# Patient Record
Sex: Female | Born: 1945 | Race: White | Hispanic: No | Marital: Single | Smoking: Former smoker
Health system: Southern US, Community
[De-identification: ages and names within clinical notes are randomized; demographics above are authoritative.]

## PROBLEM LIST (undated history)

## (undated) DIAGNOSIS — B182 Chronic viral hepatitis C: Secondary | ICD-10-CM

## (undated) DIAGNOSIS — K7460 Unspecified cirrhosis of liver: Secondary | ICD-10-CM

## (undated) DIAGNOSIS — Z9289 Personal history of other medical treatment: Secondary | ICD-10-CM

## (undated) DIAGNOSIS — I10 Essential (primary) hypertension: Secondary | ICD-10-CM

## (undated) HISTORY — PX: CHOLECYSTECTOMY: SHX55

## (undated) HISTORY — PX: TUBAL LIGATION: SHX77

## (undated) HISTORY — DX: Personal history of other medical treatment: Z92.89

## (undated) HISTORY — PX: TOTAL HIP ARTHROPLASTY: SHX124

## (undated) HISTORY — PX: NECK SURGERY: SHX720

---

## 2007-11-23 ENCOUNTER — Other Ambulatory Visit: Payer: Self-pay

## 2007-11-23 ENCOUNTER — Inpatient Hospital Stay: Payer: Self-pay | Admitting: Internal Medicine

## 2008-07-03 ENCOUNTER — Ambulatory Visit: Payer: Self-pay

## 2008-11-05 ENCOUNTER — Ambulatory Visit: Payer: Self-pay | Admitting: Family Medicine

## 2010-10-30 ENCOUNTER — Emergency Department: Payer: Self-pay | Admitting: Emergency Medicine

## 2011-01-13 ENCOUNTER — Emergency Department: Payer: Self-pay | Admitting: Emergency Medicine

## 2011-02-23 ENCOUNTER — Ambulatory Visit: Payer: Self-pay | Admitting: Sports Medicine

## 2012-06-11 ENCOUNTER — Emergency Department: Payer: Self-pay | Admitting: Emergency Medicine

## 2012-06-12 LAB — DRUG SCREEN, URINE
Amphetamines, Ur Screen: NEGATIVE (ref ?–1000)
Barbiturates, Ur Screen: NEGATIVE (ref ?–200)
Cocaine Metabolite,Ur ~~LOC~~: NEGATIVE (ref ?–300)
MDMA (Ecstasy)Ur Screen: NEGATIVE (ref ?–500)
Methadone, Ur Screen: NEGATIVE (ref ?–300)
Opiate, Ur Screen: POSITIVE (ref ?–300)
Phencyclidine (PCP) Ur S: NEGATIVE (ref ?–25)
Tricyclic, Ur Screen: NEGATIVE (ref ?–1000)

## 2012-06-12 LAB — LIPASE, BLOOD: Lipase: 188 U/L (ref 73–393)

## 2012-06-12 LAB — CBC
MCH: 34 pg (ref 26.0–34.0)
Platelet: 110 10*3/uL — ABNORMAL LOW (ref 150–440)
RDW: 15.7 % — ABNORMAL HIGH (ref 11.5–14.5)

## 2012-06-12 LAB — COMPREHENSIVE METABOLIC PANEL
Alkaline Phosphatase: 187 U/L — ABNORMAL HIGH (ref 50–136)
Anion Gap: 9 (ref 7–16)
BUN: 13 mg/dL (ref 7–18)
Bilirubin,Total: 0.8 mg/dL (ref 0.2–1.0)
Calcium, Total: 8.3 mg/dL — ABNORMAL LOW (ref 8.5–10.1)
Chloride: 106 mmol/L (ref 98–107)
Co2: 24 mmol/L (ref 21–32)
Creatinine: 0.94 mg/dL (ref 0.60–1.30)
EGFR (African American): >60
EGFR (Non-African Amer.): >60
Osmolality: 277 (ref 275–301)
Sodium: 139 mmol/L (ref 136–145)
Total Protein: 7.8 g/dL (ref 6.4–8.2)

## 2012-06-12 LAB — URINALYSIS, COMPLETE
Bilirubin,UR: NEGATIVE
Blood: NEGATIVE
Ketone: NEGATIVE
Leukocyte Esterase: NEGATIVE
Ph: 5 (ref 4.5–8.0)
Protein: NEGATIVE
RBC,UR: 4 /HPF (ref 0–5)
Specific Gravity: 1.019 (ref 1.003–1.030)
Squamous Epithelial: 2

## 2012-06-12 LAB — ETHANOL: Ethanol %: 0.119 % — ABNORMAL HIGH (ref 0.000–0.080)

## 2012-07-07 ENCOUNTER — Ambulatory Visit: Payer: Self-pay | Admitting: Family Medicine

## 2012-08-08 ENCOUNTER — Ambulatory Visit: Payer: Self-pay | Admitting: Internal Medicine

## 2012-08-21 ENCOUNTER — Ambulatory Visit: Payer: Self-pay | Admitting: Specialist

## 2012-08-21 LAB — URINALYSIS, COMPLETE
Bacteria: NONE SEEN
Glucose,UR: NEGATIVE mg/dL (ref 0–75)
Nitrite: NEGATIVE
Ph: 5 (ref 4.5–8.0)
Protein: NEGATIVE
Specific Gravity: 1.02 (ref 1.003–1.030)
WBC UR: 7 /HPF (ref 0–5)

## 2012-08-21 LAB — PROTIME-INR
INR: 1.1
Prothrombin Time: 14.7 secs (ref 11.5–14.7)

## 2012-08-21 LAB — BASIC METABOLIC PANEL
Anion Gap: 8 (ref 7–16)
BUN: 13 mg/dL (ref 7–18)
Chloride: 107 mmol/L (ref 98–107)
Co2: 27 mmol/L (ref 21–32)
Creatinine: 0.63 mg/dL (ref 0.60–1.30)
EGFR (African American): >60
EGFR (Non-African Amer.): >60
Potassium: 3.7 mmol/L (ref 3.5–5.1)
Sodium: 142 mmol/L (ref 136–145)

## 2012-08-21 LAB — HEMOGLOBIN: HGB: 12.6 g/dL (ref 12.0–16.0)

## 2012-08-31 ENCOUNTER — Inpatient Hospital Stay: Payer: Self-pay | Admitting: Specialist

## 2012-09-01 LAB — BASIC METABOLIC PANEL
BUN: 13 mg/dL (ref 7–18)
Calcium, Total: 7.8 mg/dL — ABNORMAL LOW (ref 8.5–10.1)
Chloride: 104 mmol/L (ref 98–107)
Co2: 24 mmol/L (ref 21–32)
Creatinine: 1.13 mg/dL (ref 0.60–1.30)
EGFR (African American): 59 — ABNORMAL LOW
EGFR (Non-African Amer.): 51 — ABNORMAL LOW
Glucose: 113 mg/dL — ABNORMAL HIGH (ref 65–99)
Osmolality: 271 (ref 275–301)
Sodium: 135 mmol/L — ABNORMAL LOW (ref 136–145)

## 2012-09-02 LAB — BASIC METABOLIC PANEL
Anion Gap: 8 (ref 7–16)
BUN: 21 mg/dL — ABNORMAL HIGH (ref 7–18)
Co2: 22 mmol/L (ref 21–32)
EGFR (Non-African Amer.): 33 — ABNORMAL LOW
Glucose: 140 mg/dL — ABNORMAL HIGH (ref 65–99)
Osmolality: 272 (ref 275–301)

## 2012-09-02 LAB — CBC WITH DIFFERENTIAL/PLATELET
Basophil #: 0 10*3/uL (ref 0.0–0.1)
Basophil %: 0.2 %
Eosinophil #: 0.4 10*3/uL (ref 0.0–0.7)
Eosinophil %: 3.9 %
HCT: 25.9 % — ABNORMAL LOW (ref 35.0–47.0)
HGB: 9.2 g/dL — ABNORMAL LOW (ref 12.0–16.0)
Lymphocyte %: 14.3 %
Monocyte #: 0.9 x10 3/mm (ref 0.2–0.9)
Monocyte %: 10.4 %
Neutrophil %: 71.2 %
Platelet: 92 10*3/uL — ABNORMAL LOW (ref 150–440)
RBC: 2.7 10*6/uL — ABNORMAL LOW (ref 3.80–5.20)
RDW: 15.4 % — ABNORMAL HIGH (ref 11.5–14.5)
WBC: 9.2 10*3/uL (ref 3.6–11.0)

## 2012-09-03 LAB — CBC WITH DIFFERENTIAL/PLATELET
Basophil %: 0.4 %
Eosinophil #: 0.3 10*3/uL (ref 0.0–0.7)
Eosinophil %: 2.5 %
HCT: 26.5 % — ABNORMAL LOW (ref 35.0–47.0)
HGB: 9.2 g/dL — ABNORMAL LOW (ref 12.0–16.0)
Lymphocyte #: 1.2 10*3/uL (ref 1.0–3.6)
MCH: 33.3 pg (ref 26.0–34.0)
MCHC: 34.7 g/dL (ref 32.0–36.0)
MCV: 96 fL (ref 80–100)
Monocyte #: 1.1 x10 3/mm — ABNORMAL HIGH (ref 0.2–0.9)
Monocyte %: 10.9 %
Neutrophil #: 7.6 10*3/uL — ABNORMAL HIGH (ref 1.4–6.5)
Neutrophil %: 74.5 %
Platelet: 104 10*3/uL — ABNORMAL LOW (ref 150–440)
RBC: 2.76 10*6/uL — ABNORMAL LOW (ref 3.80–5.20)

## 2012-09-03 LAB — URINALYSIS, COMPLETE
Bilirubin,UR: NEGATIVE
Glucose,UR: NEGATIVE mg/dL (ref 0–75)
Ketone: NEGATIVE
Nitrite: NEGATIVE
Ph: 6 (ref 4.5–8.0)
Protein: NEGATIVE
Specific Gravity: 1.004 (ref 1.003–1.030)
Squamous Epithelial: 4
WBC UR: 2 /HPF (ref 0–5)

## 2012-09-03 LAB — SODIUM, URINE, RANDOM: Sodium, Urine Random: 10 mmol/L (ref 20–110)

## 2012-09-04 LAB — CBC WITH DIFFERENTIAL/PLATELET
Basophil #: 0.1 10*3/uL (ref 0.0–0.1)
Eosinophil %: 5.6 %
HCT: 25.8 % — ABNORMAL LOW (ref 35.0–47.0)
Lymphocyte %: 24.8 %
MCH: 33.4 pg (ref 26.0–34.0)
MCHC: 35 g/dL (ref 32.0–36.0)
MCV: 96 fL (ref 80–100)
Monocyte %: 10.6 %
Neutrophil %: 58.1 %
Platelet: 95 10*3/uL — ABNORMAL LOW (ref 150–440)
RBC: 2.7 10*6/uL — ABNORMAL LOW (ref 3.80–5.20)
RDW: 15.7 % — ABNORMAL HIGH (ref 11.5–14.5)

## 2012-09-04 LAB — BASIC METABOLIC PANEL
Anion Gap: 7 (ref 7–16)
Calcium, Total: 7.8 mg/dL — ABNORMAL LOW (ref 8.5–10.1)
Co2: 23 mmol/L (ref 21–32)
Creatinine: 0.74 mg/dL (ref 0.60–1.30)
EGFR (African American): >60
EGFR (Non-African Amer.): >60
Glucose: 89 mg/dL (ref 65–99)
Osmolality: 287 (ref 275–301)
Sodium: 144 mmol/L (ref 136–145)

## 2012-09-05 LAB — PATHOLOGY REPORT

## 2013-08-28 ENCOUNTER — Ambulatory Visit: Payer: Self-pay | Admitting: Physical Medicine and Rehabilitation

## 2013-09-12 ENCOUNTER — Ambulatory Visit: Payer: Self-pay | Admitting: Physical Medicine and Rehabilitation

## 2013-09-24 ENCOUNTER — Ambulatory Visit: Payer: Self-pay | Admitting: Internal Medicine

## 2014-01-29 ENCOUNTER — Ambulatory Visit: Payer: Self-pay | Admitting: Otolaryngology

## 2014-01-29 LAB — CREATININE, SERUM
Creatinine: 1.35 mg/dL — ABNORMAL HIGH (ref 0.60–1.30)
EGFR (African American): 47 — ABNORMAL LOW
EGFR (Non-African Amer.): 41 — ABNORMAL LOW

## 2014-06-19 DIAGNOSIS — M5116 Intervertebral disc disorders with radiculopathy, lumbar region: Secondary | ICD-10-CM | POA: Insufficient documentation

## 2014-06-19 DIAGNOSIS — M5136 Other intervertebral disc degeneration, lumbar region: Secondary | ICD-10-CM | POA: Insufficient documentation

## 2014-08-27 ENCOUNTER — Ambulatory Visit: Payer: Self-pay | Admitting: Obstetrics and Gynecology

## 2014-08-27 LAB — HEMOGLOBIN: HGB: 13.2 g/dL (ref 12.0–16.0)

## 2014-08-27 LAB — POTASSIUM: POTASSIUM: 3.8 mmol/L (ref 3.5–5.1)

## 2014-09-02 ENCOUNTER — Ambulatory Visit: Payer: Self-pay | Admitting: Obstetrics and Gynecology

## 2015-02-18 NOTE — Consult Note (Signed)
PATIENT NAME:  Yvonne Mcdaniel, Yvonne Mcdaniel MR#:  [id number] DATE OF BIRTH:  [birth year]  DATE OF CONSULTATION:  09/03/2012  REFERRING PHYSICIAN:  Thornton Park, MD CONSULTING PHYSICIAN:  Samson Frederic, DO  PRIMARY CARE PHYSICIAN: Valentino Saxon, MD  REASON FOR CONSULTATION: Wheezing and altered mental status.   HISTORY OF PRESENT ILLNESS: This is a 69 year old female with history of severe osteoarthritis status post left hip replacement three days ago who has now developed progressive lethargy and altered mental status and apparently wheezing this evening. Today is  postoperative day three for this patient. She had left total hip arthroplasty on 08/31/2012 after which she was noted to have decreased urine output and this has persisted up until my evaluation. She has received a dose of lasix a well as cefazolin and toradol.  Per nursing, over the preceding 12 hours or so, the patient has been lethargic. However, she has become more disoriented now and had minimal urine output so Foley catheter was placed and 1000 mL of urine was collected. Upon my evaluation the patient is confused, unable to answer questions appropriately. Review of records notes that the patient's creatinine has increased from 0.63 on 08/21/2012 to 1.63 during this admission, concerning for acute renal failure.   On Review of records, the patient has received 40 mg of oxycodone over the last 12 hours as well.   PAST MEDICAL HISTORY:  1. Chronic pain.  2. Hepatitis C.  3. Hypertension.  4. Vertigo.  5. Anxiety.  6. Obesity.  7. Degenerative joint disease.   PAST SURGICAL HISTORY:  1. Cesarean section.  2. Cervical fusion.  3. Cholecystectomy.  4. Prior hospitalizations for pneumonia.   MEDICATIONS:  1. Quinapril 20 mg daily.  2. Metaxalone 800 mg 1 tablet twice a day as needed for pain.  3. Meloxicam 15 mg daily; this was stopped 08/20/2012.  4. Lorazepam 0.5 mg daily as needed for anxiety.  5. Hydrochlorothiazide 25 mg  daily.  6. Gabapentin 300 mg three times daily. 7. Amlodipine 5 mg daily  8. Acetaminophen/oxycodone 325/5 mg one tablet every eight hours as needed for pain.  ALLERGIES: No known drug allergies, per records.   SOCIAL HISTORY: The patient was a two pack per day smoker for at least eight years, quit 10 years ago. Denies alcohol or illicit drug use.   FAMILY HISTORY: Per records, history of congestive heart failure and hypertension.   REVIEW OF SYSTEMS: Unable to obtain due to the patient's mental status at this time. However, per nursing, the patient has been progressively confused, has had decreased urine output, has been afebrile, and has not been coughing but had intermittent wheeze.   PHYSICAL EXAMINATION:   VITAL SIGNS: Temperature 98.2, heart rate 100, respirations 22, blood pressure 153/70, and saturating 97% on room air   GENERAL: A well-appearing, elderly female in no acute distress, agitated.   PSYCH: The patient is disoriented. She does not respond appropriately to questions, judgment as a result is poor.   EYES: Pupils are equally round and reactive to light and accommodation. Anicteric sclerae. Normal lids.   ENT: Normal external ears and nares. Posterior oropharynx is clear.   SKIN: Warm and dry. No lesions identified. There are some excoriations over bilateral thighs. Dressing on the left hip is clean, dry, and intact without any surrounding erythema.   CARDIOVASCULAR: S1 and S2 regular rate and rhythm. No murmurs appreciated. No pretibial edema.   PULMONARY: Clear to auscultation bilaterally. No wheezes, rales, or rhonchi. Normal effort.  ABDOMEN: Soft, nontender, and nondistended with normal bowel sounds. No hepatomegaly appreciated.   MUSCULOSKELETAL: Full range of motion of upper extremities. Unable to fully assess lower extremities due to the patient's mental status. No clubbing and no cyanosis noted.   LABORATORY DATA: BMP from 09/02/2012 shows glucose 140, BUN  21, and creatinine 1.63 (this is increased from baseline creatinine of 0.63, as of 08/21/2012), serum sodium 133, potassium 4.9, chloride 103, bicarbonate 22, calcium 7.5, anion gap 8, and osmolality 272 with a GFR of 33.   CBC as 09/02/2012 shows white count 9.2, hemoglobin 9.2, hematocrit 25.9, platelet count 92, and MCV 96.   ASSESSMENT AND PLAN: A 69 year old female admitted for left total hip arthroplasty now with altered mental status, acute renal failure, SIRS, and acute anemia.  1. Toxic metabolic encephalopathy: Etiology is multifactorial including acute renal failure, recent opiate use, and possible underlying infection. We will check repeat CBC, basic metabolic panel, urinalysis, urine culture and blood culture, and check an ABG to rule out hypercarbia given her recent opiate use. We will discontinue opiates. 2. Acute renal failure: Baseline creatinine was 0.63 and now is increased to 1.63. Differential diagnosis includes urinary tension from opiate versus AIN from cefazolin or Toradol. We will also rule out possibility of urinary tract infections with urinalysis and urine culture. We will check renal ultrasound, continue IV fluids, check urine eosinophils and urine electrolytes.  We will discontinue her home diuretics and avoid all nehrotoxic agents including NSAIDS. If infection is identified, we will initiate antibiotics at that time.  3. SIRS with tachycardia and tachypnea: We will check blood and urine cultures, repeat CBC and initiate antibiotics if infectious etiology is identified. 4. Wheezing: Wheezing was not present on my exam. We will check an x-ray and avoid diuretics given acute renal failure. We will have p.r.n. nebulizers ordered and available.  5. Acute blood loss anemia: Hemoglobin on admission was 12.6 and has decreased to 9.2. We will monitor CBCs for now.  6. Chronic back pain.  7. Obesity.  8. Hepatitis C.  9. Vertigo.  10. Anxiety.   CODE STATUS: PRESUMED FULL CODE.    TIME SPENT: 45 minutes. ____________________________ Samson Frederic, DO aeo:slb D: 09/03/2012 04:59:28 ET T: 09/03/2012 15:22:22 ET JOB#: [id number]  cc: Samson Frederic, DO, <Dictator> Timoteo Gaul, MD Rachelle Hora. Heber [place], MD [place] SIGNED 09/04/2012 2:31

## 2015-02-18 NOTE — Discharge Summary (Signed)
PATIENT NAME:  Yvonne Mcdaniel, Yvonne Mcdaniel MR#:  [id number] DATE OF BIRTH:  [birth year]  DATE OF ADMISSION:  08/31/2012 DATE OF DISCHARGE:  09/05/2012  DISCHARGE DIAGNOSES:  1. Severe degenerative arthritis, left hip.  2. Postoperative anemia.  3. Postoperative transient acute renal failure.  4. Methicillin-resistant Staphylococcus aureus positive nasal swab.  5. History of hepatitis C.  6. Gout.  7. Hypertension.  8. History of pneumonia.   OPERATIONS/PROCEDURES PERFORMED: Left hip bipolar hip hemiarthroplasty.   COURSE IN HOSPITAL: Admission history and physical examination is as written on the admission. The patient was seen preoperatively by both Dr. Heber Trosky and also cardiology for medical clearance. The procedure was fully explained to the patient including the expected postoperative rehab. I explained to the patient preoperatively that if her acetabulum appeared reasonable that I would perform a bipolar left hip arthroplasty instead of a total hip arthroplasty because of her size and her limited mobility. I felt that this would be more stable with respect to dislocation. The patient underwent the procedure on 08/31/2012 and tolerated this quite well. Postoperatively she did well on postoperative day #1 but did develop a transient acute renal failure with mental status changes which quickly reversed with discontinuing her opiates and her Toradol. She was treated with hydration and her mental status and laboratory data returned to within normal limits. By 09/05/2012 she had a normal mental status and was tolerating physical therapy reasonably well. She is discharged today to the skilled nursing facility with orders as per the written order sheet. In physical therapy I only want her to be assisted up into the chair full weight-bearing and to work on transfers using the walker and ambulation using the walker full weight-bearing. Please do not attempt to exercise her leg or manipulate her leg in any way and  total hip precautions are to be maintained at all times. Please discontinue her Lovenox in 10 days and start enteric-coated aspirin 81 mg per day. Also if her wound is seen to be healing in well in 10 days without drainage or evidence of infection please remove her staples and apply Steri-Strips.   She is to return to the office to see Dr. Tamala Julian in three weeks for repeat exam and x-ray.    ____________________________ Lucas Mallow, MD ces:cms D: 09/05/2012 08:38:10 ET T: 09/05/2012 08:55:10 ET JOB#: [id number]  cc: Lucas Mallow, MD, <Dictator> Lucas Mallow MD ELECTRONICALLY SIGNED 09/06/2012 17:56

## 2015-02-18 NOTE — Consult Note (Signed)
PATIENT NAME:  Yvonne Mcdaniel, Yvonne Mcdaniel MR#:  [id number] DATE OF BIRTH:  Sep 03, 1946  DATE OF CONSULTATION:  09/03/2012  REFERRING PHYSICIAN:  Cindie Laroche, DO CONSULTING PHYSICIAN:  Mckade Gurka Lilian Kapur, MD  REASON FOR CONSULTATION: Acute renal failure.   HISTORY OF PRESENT ILLNESS: The patient is a 69 year old Caucasian female with past medical history of chronic hepatitis C secondary to blood transfusion in 1970, hypertension, glucose intolerance, gouty arthritis, chronic back pain, vertigo, and anxiety who presented to [facility] on 08/31/2012 for bipolar left hip hemiarthroplasty. We are consulted now for the evaluation and management of acute renal failure. The patient currently is unable to offer history given altered mental status. History is obtained through chart review. When she presented, the patient's creatinine was found to be 0.63. Creatinine has now risen to 1.63. There have been some isolated periods of hypotension. In addition, when the hospitalist, Dr. Cindie Laroche, saw the patient, she was found to have significant urinary retention as after Foley catheter was placed 1000 mL of urine was drained. Over the past 24 hours, the patient has had 1.3 liters of urine. She is currently receiving normal saline. Renal ultrasound is currently pending. She also appears to have acute blood loss anemia with a hemoglobin of 9.2. Urine sodium is quite low at 10. The patient has also received vancomycin. The patient was on quinapril which has been held. She was also taking hydrochlorothiazide which has now been discontinued. The patient has also received Toradol during this admission. These are all likely explanations for underlying acute renal failure.   PAST MEDICAL HISTORY:  1. Chronic hepatitis C.  2. Hypertension.  3. History of gouty arthritis.  4. Chronic back pain.  5. Vertigo.  6. Anxiety.   PAST SURGICAL HISTORY:  1. Cholecystectomy.  2. Cesarean section. 3. Cervical disk  neck surgery. 4. Left hip replacement on 08/31/2012.   ALLERGIES: No known drug allergies.   CURRENT INPATIENT MEDICATIONS:  1. Amlodipine 5 mg daily. 2. Gabapentin 300 mg three times daily. 3. Ativan 0.5 mg daily p.r.n.  4. Omeprazole 20 mg p.o. every 6:00 a.m.  5. Lovenox 30 mg subcutaneous every 12 hours.  6. Colace 100 mg twice a day. 7. Milk of Magnesia 30 mL p.o. every six hours p.r.n.   SOCIAL HISTORY: Unable to obtain directly from the patient; however, the patient previously lived in [place] but has been in [place] since 2009. No reported tobacco, alcohol, or illicit drug use.   FAMILY HISTORY: Unable to obtain directly from the patient at this time, however, from prior dictated history and physical it appears that there is a family history of congestive heart failure and hypertension.  REVIEW OF SYSTEMS: Currently unobtainable from the patient as she has altered mental status.   PHYSICAL EXAMINATION:   VITAL SIGNS: Temperature 98.3, pulse 101, respirations 20, blood pressure 152/66, and pulse oximetry 99% on room air.   GENERAL: Well-developed, well-nourished Caucasian female who is lethargic but arousable.   HEENT: Normocephalic, atraumatic. Extraocular movements were present, one eye open, of the patient's eyes. Pupils were 5 mm bilaterally and reactive to light. No scleral icterus. Conjunctivae are pale. No epistaxis noted. Unable to assess hearing at this time. Oral mucosa are dry.   NECK: Supple and without obvious jugular venous distention or lymphadenopathy.   PULMONARY: Lungs are clear to auscultation bilaterally with normal respiratory effort.   CARDIOVASCULAR: S1 and S2 with slight tachycardia noted. No murmurs or rubs.   ABDOMEN: Soft, nontender,  and nondistended. Bowel sounds positive. No rebound or guarding. No gross organomegaly appreciated.   EXTREMITIES: No clubbing, cyanosis, or edema at this time.   NEUROLOGIC: The patient is lethargic but is  arousable. Difficult to comprehend her speech at this time.   SKIN: Warm and dry. No rashes noted.   MUSCULOSKELETAL: No joint redness, swelling, or tenderness appreciated.   GU: Foley catheter noted to be in place. No suprapubic tenderness is noted at this time.   PSYCHIATRIC: Unable to assess at this time.   LABORATORY/DIAGNOSTIC DATA: Sodium 133, potassium 4.9, chloride 103, CO2 22, BUN 21, creatinine 1.63, glucose 140, and calcium 7.5. CBC shows WBC 7.2, hemoglobin 9.2, hematocrit 26, and platelets 104. Urine sodium is quite low at 10. Urine creatinine is 45.   Urinalysis is negative for protein, 13 RBCs per high-power field, 2 WBCs per high-power field.   ABG shows pH of 7.46, pCO2 29, pO2 85, and FiO2 21%.   IMPRESSION: This is a 69 year old Caucasian female with past medical history of chronic hepatitis C secondary to blood transfusion in 1970, hypertension, gouty arthritis, osteoarthritis, chronic back pain, and anxiety who underwent left hip replacement on 08/31/2012. Her hospital course is complicated by acute renal failure and altered mental status.   PROBLEM LIST:  1. Acute renal failure.  2. Hyponatremia.  3. Altered mental status.  4. Status post left hip hemiarthroplasty on 08/31/2012.  PLAN: The patient has acute renal failure at this point in time. Her baseline creatinine is 0.63. It has now risen to 1.63. Her acute renal failure is likely multifactorial with contributions from quinapril, Toradol, hydrochlorothiazide, periodic hypotension, and urinary retention. I agree with the current plan of IV fluid hydration. Foley catheter has been placed to treat any underlying urinary retention. I agree with plans for renal ultrasound. I also agree with holding hydrochlorothiazide, quinapril, and any further NSAID use. There is no acute indication for dialysis at this time as electrolytes currently are acceptable. However, the patient is at risk for further deterioration of her renal  function. Serum sodium should be corrected with the use of normal saline. In regards to her mental status, certainly acute renal failure and use of opiates could cause the current situation. However, we would recommend considering CT scan of the head as well if her alteration of mental status persists. However, I defer this further to the hospitalist.   I would like to thank Dr. Cindie Laroche for this kind referral. Further plan as the patient progresses.  ____________________________ Tama High, MD mnl:slb D: 09/03/2012 09:57:21 ET T: 09/03/2012 10:17:00 ET JOB#: [id number]  cc: Tama High, MD, <Dictator>  Tama High MD ELECTRONICALLY SIGNED 09/12/2012 10:47

## 2015-02-18 NOTE — Op Note (Signed)
PATIENT NAME:  Yvonne Mcdaniel, Yvonne Mcdaniel MR#:  [id number] DATE OF BIRTH:  [birth year]  DATE OF PROCEDURE:  08/31/2012  PREOPERATIVE DIAGNOSIS: Severe degenerative arthritis, left hip.   POSTOPERATIVE DIAGNOSIS: Severe degenerative arthritis, left hip.  PROCEDURE: Left hip bipolar arthroplasty.   SURGEON: Christophe Louis, MD   ASSISTANT: Earnestine Leys, MD   ANESTHESIA: Spinal.   COMPLICATIONS: None.   ESTIMATED BLOOD LOSS: 500 mL.   PROCEDURE: One gram of vancomycin was given intravenously prior to the procedure. Spinal anesthesia is induced. The patient is turned into the right lateral decubitus position and prepared and secured for left hip surgery in the usual manner with the hip grips. The left hip is thoroughly prepped with alcohol and ChloraPrep and draped in standard sterile fashion. Standard posterolateral incision is made and the dissection carried down through the fascia lata in the line of the skin incision. Trochanteric bursa is excised. The sciatic nerve is palpated deep within the wound and is protected throughout the case. Trochanteric bursa is excised. The piriformis tendon is dissected out, cut and tagged for later reattachment. The remainder of the external rotators are cut with the coagulation Bovie, and the capsule is revealed using the periosteal elevator. A T-shaped incision is made in the capsule and the ends are tagged for later use. The hip is then able to be dislocated and retractors are placed. The femoral neck is cut off at the appropriate angle and length. The proximal femur is then reamed in the standard fashion and broached in standard fashion up to a 12 mm in diameter short stature AML prosthesis. The acetabulum is inspected and is seen to be round, and because of the patient's size and for the purposes of attempting to prevent dislocation, a bipolar approach was chosen. The acetabulum is sized as a 44 mm, and the acetabular sizers are used to confirm this. The acetabulum is  cleared of all debris. The trial reduction using the bipolar components and a 1.5 femoral neck length is used and is seen to be satisfactory with good neck length and good hip stability. There was some tendency for posterior dislocation, and therefore it was felt that the Prodigy anterior anteversion prosthesis should be used. All trial components are removed and the joint and bone are thoroughly irrigated multiple times with the pulsatile lavage. The 12 mm short stature Prodigy prosthesis is impacted into place and seen to be a secure fit. The 1.5 mm ball with the associated 44 mm bipolar components are placed over the ball. The hip is reduced and is seen to be stable with good leg lengths. The posterior capsule is securely repaired with #2 Ethibond. The capsule and the piriformis tendon are securely repaired back to the abductor mechanism. The fascia lata is closed with #2 Ethibond. Subcutaneous tissue is closed with 2-0 Vicryl, and the skin is closed with the skin stapler. A soft bulky dressing is applied. The patient is returned to the hospital bed and placed in an abduction pillow and returned to the recovery room in satisfactory condition having tolerated the procedure quite well.   ____________________________ Lucas Mallow, MD ces:cbb D: 08/31/2012 15:08:00 ET T: 08/31/2012 15:39:36 ET JOB#: [id number]  cc: Lucas Mallow, MD, <Dictator> Lucas Mallow MD ELECTRONICALLY SIGNED 09/01/2012 9:44

## 2015-02-22 NOTE — Op Note (Signed)
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  OPERATION: LEEP cone.  Biopsy of the cervix.   ANESTHESIA: General.   SURGEON: Rubie Maid, M.D.   ESTIMATED BLOOD LOSS:    25 mL   OPERATIVE FLUIDS: 700 mL.   URINE OUTPUT:  200 mL.   COMPLICATIONS: None.   FINDINGS:  A stenotic cervical os. Lugol staining with decreased uptake at approximately 12 o'clock.   SPECIMEN:  Ectocervix and endocervix.   CONDITION: Stable.   PROCEDURE: The patient was taken to the operating room where she was placed under general anesthesia without difficulty. She was then prepped and draped in a normal sterile fashion and placed in the dorsal lithotomy position using candy cane stirrups. Straight catheterization was performed with return of 200 mL of clear urine. Next, a Gray speculum was placed in the vagina. Lugol solution was then painted along the entire cervix and vaginal wall. Areas of non-uptake were noted to be around the entire squamocolumnar junction and at the 12:00 region of the cervix. Approximately 10 mL of 1% Xylocaine with epinephrine 1:100,000 was injected circumferentially along the cervix. The large loop electrode was then used to remove the  ectocervix. The specimen was excised and sent to the pathologist. Next, a smaller loop electrode was used to obtain a top hat for excision of the endocervix.  An endocervical curettage was attempted; however, could not be performed as the endocervix was still noted to be stenotic and could not find entrance into the cervical canal to be able to collect the endocervical specimens. The bed of the excised cervical tissue along the cervix was then cauterized using the rollerball. Hemostasis was noted. All instruments were removed from the vagina. At this point, the patient tolerated the procedure well without complication and was taken to the recovery  room in stable condition.   ____________________________ Chesley Noon Marcelline Mates, MD asc:jw D: 09/02/2014 08:45:40 ET T: 09/02/2014 15:18:58 ET JOB#: [id number]  cc: Chesley Noon. Marcelline Mates, MD, <Dictator> Augusto Gamble MD ELECTRONICALLY SIGNED 09/20/2014 0:29

## 2015-02-24 LAB — SURGICAL PATHOLOGY

## 2015-08-10 ENCOUNTER — Emergency Department: Payer: Medicare Other

## 2015-08-10 ENCOUNTER — Inpatient Hospital Stay
Admission: EM | Admit: 2015-08-10 | Discharge: 2015-08-16 | DRG: 871 | Disposition: A | Discharge status: Home / Self Care | Payer: Medicare Other | Attending: Internal Medicine | Admitting: Internal Medicine

## 2015-08-10 ENCOUNTER — Encounter: Payer: Self-pay | Admitting: Emergency Medicine

## 2015-08-10 DIAGNOSIS — E119 Type 2 diabetes mellitus without complications: Secondary | ICD-10-CM

## 2015-08-10 DIAGNOSIS — I248 Other forms of acute ischemic heart disease: Secondary | ICD-10-CM | POA: Diagnosis present

## 2015-08-10 DIAGNOSIS — R112 Nausea with vomiting, unspecified: Secondary | ICD-10-CM

## 2015-08-10 DIAGNOSIS — D696 Thrombocytopenia, unspecified: Secondary | ICD-10-CM | POA: Diagnosis present

## 2015-08-10 DIAGNOSIS — R778 Other specified abnormalities of plasma proteins: Secondary | ICD-10-CM

## 2015-08-10 DIAGNOSIS — E872 Acidosis, unspecified: Secondary | ICD-10-CM | POA: Diagnosis present

## 2015-08-10 DIAGNOSIS — K7290 Hepatic failure, unspecified without coma: Secondary | ICD-10-CM

## 2015-08-10 DIAGNOSIS — Z8249 Family history of ischemic heart disease and other diseases of the circulatory system: Secondary | ICD-10-CM

## 2015-08-10 DIAGNOSIS — K7682 Hepatic encephalopathy: Secondary | ICD-10-CM

## 2015-08-10 DIAGNOSIS — D684 Acquired coagulation factor deficiency: Secondary | ICD-10-CM | POA: Diagnosis present

## 2015-08-10 DIAGNOSIS — E669 Obesity, unspecified: Secondary | ICD-10-CM | POA: Diagnosis present

## 2015-08-10 DIAGNOSIS — I129 Hypertensive chronic kidney disease with stage 1 through stage 4 chronic kidney disease, or unspecified chronic kidney disease: Secondary | ICD-10-CM | POA: Diagnosis present

## 2015-08-10 DIAGNOSIS — N189 Chronic kidney disease, unspecified: Secondary | ICD-10-CM | POA: Diagnosis present

## 2015-08-10 DIAGNOSIS — K7460 Unspecified cirrhosis of liver: Secondary | ICD-10-CM | POA: Diagnosis present

## 2015-08-10 DIAGNOSIS — Z87891 Personal history of nicotine dependence: Secondary | ICD-10-CM

## 2015-08-10 DIAGNOSIS — D689 Coagulation defect, unspecified: Secondary | ICD-10-CM

## 2015-08-10 DIAGNOSIS — K769 Liver disease, unspecified: Secondary | ICD-10-CM

## 2015-08-10 DIAGNOSIS — Z6832 Body mass index (BMI) 32.0-32.9, adult: Secondary | ICD-10-CM

## 2015-08-10 DIAGNOSIS — E1165 Type 2 diabetes mellitus with hyperglycemia: Secondary | ICD-10-CM | POA: Diagnosis present

## 2015-08-10 DIAGNOSIS — G9340 Encephalopathy, unspecified: Secondary | ICD-10-CM | POA: Diagnosis present

## 2015-08-10 DIAGNOSIS — I471 Supraventricular tachycardia: Secondary | ICD-10-CM | POA: Diagnosis present

## 2015-08-10 DIAGNOSIS — I4891 Unspecified atrial fibrillation: Secondary | ICD-10-CM | POA: Diagnosis present

## 2015-08-10 DIAGNOSIS — E1122 Type 2 diabetes mellitus with diabetic chronic kidney disease: Secondary | ICD-10-CM | POA: Diagnosis present

## 2015-08-10 DIAGNOSIS — A419 Sepsis, unspecified organism: Secondary | ICD-10-CM | POA: Diagnosis present

## 2015-08-10 DIAGNOSIS — R6520 Severe sepsis without septic shock: Secondary | ICD-10-CM | POA: Diagnosis present

## 2015-08-10 DIAGNOSIS — E1142 Type 2 diabetes mellitus with diabetic polyneuropathy: Secondary | ICD-10-CM

## 2015-08-10 DIAGNOSIS — N289 Disorder of kidney and ureter, unspecified: Secondary | ICD-10-CM

## 2015-08-10 DIAGNOSIS — K7200 Acute and subacute hepatic failure without coma: Secondary | ICD-10-CM | POA: Diagnosis present

## 2015-08-10 DIAGNOSIS — R7989 Other specified abnormal findings of blood chemistry: Secondary | ICD-10-CM | POA: Diagnosis present

## 2015-08-10 DIAGNOSIS — G609 Hereditary and idiopathic neuropathy, unspecified: Secondary | ICD-10-CM

## 2015-08-10 DIAGNOSIS — N179 Acute kidney failure, unspecified: Secondary | ICD-10-CM | POA: Diagnosis present

## 2015-08-10 DIAGNOSIS — R0682 Tachypnea, not elsewhere classified: Secondary | ICD-10-CM | POA: Diagnosis present

## 2015-08-10 DIAGNOSIS — I4892 Unspecified atrial flutter: Secondary | ICD-10-CM

## 2015-08-10 DIAGNOSIS — Z79899 Other long term (current) drug therapy: Secondary | ICD-10-CM

## 2015-08-10 DIAGNOSIS — B182 Chronic viral hepatitis C: Secondary | ICD-10-CM | POA: Diagnosis present

## 2015-08-10 DIAGNOSIS — E876 Hypokalemia: Secondary | ICD-10-CM | POA: Diagnosis present

## 2015-08-10 HISTORY — DX: Unspecified cirrhosis of liver: K74.60

## 2015-08-10 HISTORY — DX: Essential (primary) hypertension: I10

## 2015-08-10 HISTORY — DX: Chronic viral hepatitis C: B18.2

## 2015-08-10 LAB — CBC WITH DIFFERENTIAL/PLATELET
Basophils Absolute: 0 10*3/uL (ref 0–0.1)
Basophils Relative: 0 %
Eosinophils Absolute: 0 10*3/uL (ref 0–0.7)
Eosinophils Relative: 0 %
HEMATOCRIT: 37.9 % (ref 35.0–47.0)
HEMOGLOBIN: 12.5 g/dL (ref 12.0–16.0)
LYMPHS ABS: 0.9 10*3/uL — AB (ref 1.0–3.6)
LYMPHS PCT: 8 %
MCH: 32 pg (ref 26.0–34.0)
MCHC: 33 g/dL (ref 32.0–36.0)
MCV: 97 fL (ref 80.0–100.0)
MONO ABS: 0.6 10*3/uL (ref 0.2–0.9)
MONOS PCT: 6 %
NEUTROS ABS: 9.6 10*3/uL — AB (ref 1.4–6.5)
NEUTROS PCT: 86 %
Platelets: 130 10*3/uL — ABNORMAL LOW (ref 150–440)
RBC: 3.91 MIL/uL (ref 3.80–5.20)
RDW: 15.3 % — AB (ref 11.5–14.5)
WBC: 11.2 10*3/uL — ABNORMAL HIGH (ref 3.6–11.0)

## 2015-08-10 MED ORDER — SODIUM CHLORIDE 0.9 % IV BOLUS (SEPSIS)
1000.0000 mL | Freq: Once | INTRAVENOUS | Status: AC
  Administered 2015-08-10: 1000 mL via INTRAVENOUS

## 2015-08-10 NOTE — ED Notes (Signed)
Pt assisted on bedpan at request to urinate; perineum clensed thoroughly in prep for cc urine specimen; redness noted around rectal area; pt unable to void

## 2015-08-10 NOTE — ED Notes (Signed)
MD in to assess pt; to alert to self and place but unable to state correct date; pt admits to recent N/V/D; denies c/o at present

## 2015-08-10 NOTE — ED Notes (Signed)
EMS called by patient's daughter for general sickness, nausea, vomiting and confusion.  Patient reports not feeling well today.  C/o being could.  Denies cough, or shortness of breath.  C/o nausea and some vomiting today

## 2015-08-10 NOTE — ED Notes (Addendum)
Pt requesting to urinate; assisted up to room commode with stand-by assist but pt unable to void; increased HR noted with exertion 140's; daughter at bedside; MD notified

## 2015-08-10 NOTE — ED Provider Notes (Signed)
[facility] Emergency Department Provider Note  ____________________________________________  Time seen: Approximately 11:19 PM  I have reviewed the triage vital signs and the nursing notes.   HISTORY  Chief Complaint Nausea and Emesis    HPI Yvonne Mcdaniel is a 69 y.o. female who presents to the ED from home via EMS for fever, chills, generalized malaise, nausea, vomiting and confusion. Patient reports symptom onset this evening. Complains of being cold, chills and rigors. Complains of lower abdominal pain. Denies cough, chest pain, shortness of breath, diarrhea, dysuria.Denies recent travel or trauma.   Past Medical History  Diagnosis Date  . Hypertension   Hepatitis C   There are no active problems to display for this patient.   Past surgical history C-section Cholecystectomy  No current outpatient prescriptions on file.  Allergies Review of patient's allergies indicates no known allergies.  No family history on file.  Social History Social History  Substance Use Topics  . Smoking status: Former Research scientist (life sciences)  . Smokeless tobacco: None  . Alcohol Use: No    Review of Systems Constitutional: Positive for fever/chills Eyes: No visual changes. ENT: No sore throat. Cardiovascular: Denies chest pain. Respiratory: Denies shortness of breath. Gastrointestinal: Positive for abdominal pain.  Positive for nausea and vomiting.  No diarrhea.  No constipation. Genitourinary: Negative for dysuria. Musculoskeletal: Negative for back pain. Skin: Negative for rash. Neurological: Negative for headaches, focal weakness or numbness.  10-point ROS otherwise negative.  ____________________________________________   PHYSICAL EXAM:  VITAL SIGNS: ED Triage Vitals  Enc Vitals Group     BP 08/10/15 2306 170/90 mmHg     Pulse Rate 08/10/15 2306 134     Resp 08/10/15 2306 22     Temp 08/10/15 2306 99 F (37.2 C)     Temp Source 08/10/15 2306 Oral   SpO2 08/10/15 2306 100 %     Weight 08/10/15 2306 160 lb (72.576 kg)     Height 08/10/15 2306 5\' 5"  (1.651 m)     Head Cir --      Peak Flow --      Pain Score 08/10/15 2308 3     Pain Loc --      Pain Edu? --      Excl. in [place]? --     Constitutional: Alert and oriented. Well appearing and in mild acute distress. Shaking chills. Eyes: Conjunctivae are normal. PERRL. EOMI. Head: Atraumatic. Nose: No congestion/rhinnorhea. Mouth/Throat: Mucous membranes are moist.  Oropharynx non-erythematous. Neck: No stridor. No carotid bruits. Cardiovascular: Tachycardic rate, regular rhythm. Grossly normal heart sounds.  Good peripheral circulation. Respiratory: Normal respiratory effort.  No retractions. Lungs CTAB. Gastrointestinal: Soft and nontender. No distention. No abdominal bruits. No CVA tenderness. Musculoskeletal: No lower extremity tenderness. BLE 1+ nonpitting edema.  No joint effusions. Neurologic:  Normal speech and language. No gross focal neurologic deficits are appreciated. Alert and oriented 2.  Skin:  Skin is warm, dry and intact. No rash noted. No petechiae. Psychiatric: Mood and affect are normal. Speech and behavior are normal.  ____________________________________________   LABS (all labs ordered are listed, but only abnormal results are displayed)  Labs Reviewed  CBC WITH DIFFERENTIAL/PLATELET - Abnormal; Notable for the following:    WBC 11.2 (*)    RDW 15.3 (*)    Platelets 130 (*)    Neutro Abs 9.6 (*)    Lymphs Abs 0.9 (*)    All other components within normal limits  COMPREHENSIVE METABOLIC PANEL - Abnormal; Notable for the  following:    CO2 16 (*)    Glucose, Bld 338 (*)    BUN 23 (*)    Creatinine, Ser 1.81 (*)    Albumin 2.9 (*)    AST 124 (*)    ALT 59 (*)    Alkaline Phosphatase 223 (*)    Total Bilirubin 4.2 (*)    GFR calc non Af Amer 28 (*)    GFR calc Af Amer 32 (*)    Anion gap 22 (*)    All other components within normal limits  LIPASE,  BLOOD - Abnormal; Notable for the following:    Lipase 54 (*)    All other components within normal limits  TROPONIN I - Abnormal; Notable for the following:    Troponin I 0.20 (*)    All other components within normal limits  URINALYSIS COMPLETEWITH MICROSCOPIC ([facility] ONLY) - Abnormal; Notable for the following:    Color, Urine AMBER (*)    APPearance HAZY (*)    Glucose, UA 150 (*)    Ketones, ur TRACE (*)    Hgb urine dipstick 1+ (*)    Protein, ur 30 (*)    Bacteria, UA RARE (*)    Squamous Epithelial / LPF 0-5 (*)    All other components within normal limits  LACTIC ACID, PLASMA - Abnormal; Notable for the following:    Lactic Acid, Venous 14.5 (*)    All other components within normal limits  CULTURE, BLOOD (ROUTINE X 2)  CULTURE, BLOOD (ROUTINE X 2)  URINE CULTURE  LACTIC ACID, PLASMA   ____________________________________________  EKG  ED ECG REPORT I, Benelli Winther J, the attending physician, personally viewed and interpreted this ECG.   Date: 08/10/2015  EKG Time: 2309  Rate: 137  Rhythm: sinus tachycardia  Axis: Normal  Intervals:none  ST&T Change: Nonspecific  ____________________________________________  RADIOLOGY  Portable chest x-ray (viewed by me, interpreted per Dr. Gerilyn Nestle): No active disease.  CT head interpreted per Dr. Gerilyn Nestle: No acute intracranial abnormalities. Mild chronic atrophy and small vessel ischemic changes.  CT abdomen/pelvis interpreted per Dr. Nevada Crane: 1. Cirrhotic liver with 2 cm low-density lesion in the right lobe suspicious for hepatocellular carcinoma in this setting. Correlate with AFP level. 2. 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 3. Calcified aortic atherosclerosis. Advanced degenerative changes in the spine and at the right hip. __________________________________________   PROCEDURES  Procedure(s) performed:  None  Critical Care performed: Yes, see critical care note(s)   CRITICAL CARE Performed by: Paulette Blanch   Total critical care time: 30 minutes  Critical care time was exclusive of separately billable procedures and treating other patients.  Critical care was necessary to treat or prevent imminent or life-threatening deterioration.  Critical care was time spent personally by me on the following activities: development of treatment plan with patient and/or surrogate as well as nursing, discussions with consultants, evaluation of patient's response to treatment, examination of patient, obtaining history from patient or surrogate, ordering and performing treatments and interventions, ordering and review of laboratory studies, ordering and review of radiographic studies, pulse oximetry and re-evaluation of patient's condition.  ____________________________________________   INITIAL IMPRESSION / ASSESSMENT AND PLAN / ED COURSE  Pertinent labs & imaging results that were available during my care of the patient were reviewed by me and considered in my medical decision making (see chart for details).  69 year old female who presents with sudden onset of rigors,  chills, generalized malaise with nausea and vomiting. Symptoms concerning for SIRS/sepsis. Will obtain blood cultures, screening labs, urinalysis and urine culture, chest x-ray. Initiate IV fluid resuscitation and reassess.  ----------------------------------------- 11:49 PM on 08/10/2015 -----------------------------------------  Patient's daughter at bedside. States she checked on patient this evening and found patient lying in the bed with dry heaving. Concerned that patient seemed confused. Will add CT head.  ----------------------------------------- 12:32 AM on 08/11/2015 -----------------------------------------  Notified of critically elevated lactate and troponin. Noted elevated T bili. Patient vomited small amounts; abdomen  remains nontender. However, given elevated lactate and bilirubin, will obtain CT abdomen/pelvis to evaluate for intra-abdominal pathology. Unable to give IV contrast secondary to low renal function. Unable to give PO contrast secondary to vomiting. Code sepsis called. IV antibiotics ordered.  ----------------------------------------- 1:02 AM on 08/11/2015 -----------------------------------------  Discuss with hospitalist to evaluate in the emergency department for admission. Updated daughter of laboratory and imaging results. Patient voicing no complaints of nausea. ___________________________________________   FINAL CLINICAL IMPRESSION(S) / ED DIAGNOSES  Final diagnoses:  Sepsis, due to unspecified organism ([place])  Non-intractable vomiting with nausea, vomiting of unspecified type  Acute liver failure without hepatic coma  Elevated troponin  Renal insufficiency      Paulette Blanch, MD 08/11/15 [phone]

## 2015-08-10 NOTE — ED Notes (Signed)
PCXR completed.

## 2015-08-11 ENCOUNTER — Emergency Department: Payer: Medicare Other

## 2015-08-11 ENCOUNTER — Encounter: Payer: Self-pay | Admitting: Internal Medicine

## 2015-08-11 DIAGNOSIS — I248 Other forms of acute ischemic heart disease: Secondary | ICD-10-CM | POA: Diagnosis present

## 2015-08-11 DIAGNOSIS — I4892 Unspecified atrial flutter: Secondary | ICD-10-CM | POA: Diagnosis present

## 2015-08-11 DIAGNOSIS — E1122 Type 2 diabetes mellitus with diabetic chronic kidney disease: Secondary | ICD-10-CM | POA: Diagnosis present

## 2015-08-11 DIAGNOSIS — I129 Hypertensive chronic kidney disease with stage 1 through stage 4 chronic kidney disease, or unspecified chronic kidney disease: Secondary | ICD-10-CM | POA: Diagnosis present

## 2015-08-11 DIAGNOSIS — N289 Disorder of kidney and ureter, unspecified: Secondary | ICD-10-CM | POA: Diagnosis present

## 2015-08-11 DIAGNOSIS — D696 Thrombocytopenia, unspecified: Secondary | ICD-10-CM | POA: Diagnosis present

## 2015-08-11 DIAGNOSIS — I4891 Unspecified atrial fibrillation: Secondary | ICD-10-CM | POA: Diagnosis present

## 2015-08-11 DIAGNOSIS — D684 Acquired coagulation factor deficiency: Secondary | ICD-10-CM | POA: Diagnosis present

## 2015-08-11 DIAGNOSIS — G609 Hereditary and idiopathic neuropathy, unspecified: Secondary | ICD-10-CM | POA: Diagnosis present

## 2015-08-11 DIAGNOSIS — N189 Chronic kidney disease, unspecified: Secondary | ICD-10-CM | POA: Diagnosis present

## 2015-08-11 DIAGNOSIS — A419 Sepsis, unspecified organism: Secondary | ICD-10-CM | POA: Diagnosis present

## 2015-08-11 DIAGNOSIS — Z79899 Other long term (current) drug therapy: Secondary | ICD-10-CM | POA: Diagnosis not present

## 2015-08-11 DIAGNOSIS — G9340 Encephalopathy, unspecified: Secondary | ICD-10-CM | POA: Diagnosis present

## 2015-08-11 DIAGNOSIS — R6520 Severe sepsis without septic shock: Secondary | ICD-10-CM

## 2015-08-11 DIAGNOSIS — E872 Acidosis, unspecified: Secondary | ICD-10-CM | POA: Diagnosis present

## 2015-08-11 DIAGNOSIS — Z87891 Personal history of nicotine dependence: Secondary | ICD-10-CM | POA: Diagnosis not present

## 2015-08-11 DIAGNOSIS — K7689 Other specified diseases of liver: Secondary | ICD-10-CM

## 2015-08-11 DIAGNOSIS — Z8249 Family history of ischemic heart disease and other diseases of the circulatory system: Secondary | ICD-10-CM | POA: Diagnosis not present

## 2015-08-11 DIAGNOSIS — R7989 Other specified abnormal findings of blood chemistry: Secondary | ICD-10-CM | POA: Diagnosis present

## 2015-08-11 DIAGNOSIS — E876 Hypokalemia: Secondary | ICD-10-CM | POA: Diagnosis present

## 2015-08-11 DIAGNOSIS — E1165 Type 2 diabetes mellitus with hyperglycemia: Secondary | ICD-10-CM | POA: Diagnosis present

## 2015-08-11 DIAGNOSIS — B182 Chronic viral hepatitis C: Secondary | ICD-10-CM | POA: Diagnosis present

## 2015-08-11 DIAGNOSIS — K769 Liver disease, unspecified: Secondary | ICD-10-CM | POA: Diagnosis not present

## 2015-08-11 DIAGNOSIS — R0682 Tachypnea, not elsewhere classified: Secondary | ICD-10-CM | POA: Diagnosis present

## 2015-08-11 DIAGNOSIS — I471 Supraventricular tachycardia: Secondary | ICD-10-CM | POA: Diagnosis present

## 2015-08-11 DIAGNOSIS — N179 Acute kidney failure, unspecified: Secondary | ICD-10-CM | POA: Diagnosis present

## 2015-08-11 DIAGNOSIS — K7200 Acute and subacute hepatic failure without coma: Secondary | ICD-10-CM | POA: Diagnosis present

## 2015-08-11 DIAGNOSIS — E669 Obesity, unspecified: Secondary | ICD-10-CM | POA: Diagnosis present

## 2015-08-11 DIAGNOSIS — Z6832 Body mass index (BMI) 32.0-32.9, adult: Secondary | ICD-10-CM | POA: Diagnosis not present

## 2015-08-11 DIAGNOSIS — R778 Other specified abnormalities of plasma proteins: Secondary | ICD-10-CM | POA: Diagnosis present

## 2015-08-11 DIAGNOSIS — K7460 Unspecified cirrhosis of liver: Secondary | ICD-10-CM | POA: Diagnosis present

## 2015-08-11 DIAGNOSIS — R4182 Altered mental status, unspecified: Secondary | ICD-10-CM | POA: Diagnosis not present

## 2015-08-11 LAB — COMPREHENSIVE METABOLIC PANEL
ALK PHOS: 195 U/L — AB (ref 38–126)
ALT: 59 U/L — AB (ref 14–54)
ALT: 54 U/L (ref 14–54)
ANION GAP: 14 (ref 5–15)
AST: 124 U/L — ABNORMAL HIGH (ref 15–41)
AST: 105 U/L — ABNORMAL HIGH (ref 15–41)
Albumin: 2.9 g/dL — ABNORMAL LOW (ref 3.5–5.0)
Albumin: 2.6 g/dL — ABNORMAL LOW (ref 3.5–5.0)
Alkaline Phosphatase: 223 U/L — ABNORMAL HIGH (ref 38–126)
Anion gap: 22 — ABNORMAL HIGH (ref 5–15)
BUN: 23 mg/dL — ABNORMAL HIGH (ref 6–20)
BUN: 22 mg/dL — ABNORMAL HIGH (ref 6–20)
CALCIUM: 9.7 mg/dL (ref 8.9–10.3)
CALCIUM: 8.8 mg/dL — AB (ref 8.9–10.3)
CHLORIDE: 102 mmol/L (ref 101–111)
CO2: 16 mmol/L — ABNORMAL LOW (ref 22–32)
CO2: 19 mmol/L — AB (ref 22–32)
CREATININE: 1.81 mg/dL — AB (ref 0.44–1.00)
CREATININE: 1.56 mg/dL — AB (ref 0.44–1.00)
Chloride: 108 mmol/L (ref 101–111)
GFR calc Af Amer: 32 mL/min — ABNORMAL LOW (ref >60)
GFR calc Af Amer: 39 mL/min — ABNORMAL LOW (ref >60)
GFR, EST NON AFRICAN AMERICAN: 28 mL/min — AB (ref >60)
GFR, EST NON AFRICAN AMERICAN: 33 mL/min — AB (ref >60)
Glucose, Bld: 338 mg/dL — ABNORMAL HIGH (ref 65–99)
Glucose, Bld: 354 mg/dL — ABNORMAL HIGH (ref 65–99)
Potassium: 3.8 mmol/L (ref 3.5–5.1)
Potassium: 3.7 mmol/L (ref 3.5–5.1)
SODIUM: 141 mmol/L (ref 135–145)
Sodium: 140 mmol/L (ref 135–145)
TOTAL PROTEIN: 8 g/dL (ref 6.5–8.1)
TOTAL PROTEIN: 7.1 g/dL (ref 6.5–8.1)
Total Bilirubin: 4.2 mg/dL — ABNORMAL HIGH (ref 0.3–1.2)
Total Bilirubin: 3.6 mg/dL — ABNORMAL HIGH (ref 0.3–1.2)

## 2015-08-11 LAB — URINALYSIS COMPLETE WITH MICROSCOPIC (ARMC ONLY)
Bilirubin Urine: NEGATIVE
CELLULAR CAST UA: 5
Glucose, UA: 150 mg/dL — AB
Leukocytes, UA: NEGATIVE
Nitrite: NEGATIVE
PH: 5 (ref 5.0–8.0)
PROTEIN: 30 mg/dL — AB
Specific Gravity, Urine: 1.019 (ref 1.005–1.030)

## 2015-08-11 LAB — CBC
HCT: 34.5 % — ABNORMAL LOW (ref 35.0–47.0)
HEMOGLOBIN: 11.8 g/dL — AB (ref 12.0–16.0)
MCH: 33.2 pg (ref 26.0–34.0)
MCHC: 34.2 g/dL (ref 32.0–36.0)
MCV: 96.9 fL (ref 80.0–100.0)
PLATELETS: 105 10*3/uL — AB (ref 150–440)
RBC: 3.56 MIL/uL — AB (ref 3.80–5.20)
RDW: 15.9 % — ABNORMAL HIGH (ref 11.5–14.5)
WBC: 11.6 10*3/uL — AB (ref 3.6–11.0)

## 2015-08-11 LAB — GLUCOSE, CAPILLARY
GLUCOSE-CAPILLARY: 304 mg/dL — AB (ref 65–99)
GLUCOSE-CAPILLARY: 206 mg/dL — AB (ref 65–99)

## 2015-08-11 LAB — BLOOD GAS, ARTERIAL
Acid-base deficit: 4.9 mmol/L — ABNORMAL HIGH (ref 0.0–2.0)
Bicarbonate: 17.7 mEq/L — ABNORMAL LOW (ref 21.0–28.0)
FIO2: 0.21
O2 SAT: 98 %
PCO2 ART: 26 mmHg — AB (ref 32.0–48.0)
Patient temperature: 37
pH, Arterial: 7.44 (ref 7.350–7.450)
pO2, Arterial: 100 mmHg (ref 83.0–108.0)

## 2015-08-11 LAB — LACTIC ACID, PLASMA
Lactic Acid, Venous: 14.5 mmol/L (ref 0.5–2.0)
Lactic Acid, Venous: 8.5 mmol/L (ref 0.5–2.0)

## 2015-08-11 LAB — TROPONIN I: TROPONIN I: 0.2 ng/mL — AB (ref <0.031)

## 2015-08-11 LAB — AMMONIA: AMMONIA: 36 umol/L — AB (ref 9–35)

## 2015-08-11 LAB — PROTIME-INR
INR: 1.76
Prothrombin Time: 20.7 seconds — ABNORMAL HIGH (ref 11.4–15.0)

## 2015-08-11 LAB — LIPASE, BLOOD: LIPASE: 54 U/L — AB (ref 22–51)

## 2015-08-11 MED ORDER — SODIUM CHLORIDE 0.9 % IV BOLUS (SEPSIS): 500.0000 mL | INTRAVENOUS | Status: DC

## 2015-08-11 MED ORDER — VANCOMYCIN HCL IN DEXTROSE 1-5 GM/200ML-% IV SOLN
1000.0000 mg | INTRAVENOUS | Status: DC
  Administered 2015-08-11 – 2015-08-12 (×2): 1000 mg via INTRAVENOUS
  Filled 2015-08-11 (×5): qty 200

## 2015-08-11 MED ORDER — SODIUM CHLORIDE 0.9 % IV BOLUS (SEPSIS)
1000.0000 mL | INTRAVENOUS | Status: DC
  Administered 2015-08-11: 1000 mL via INTRAVENOUS

## 2015-08-11 MED ORDER — ONDANSETRON HCL 4 MG/2ML IJ SOLN
4.0000 mg | Freq: Four times a day (QID) | INTRAMUSCULAR | Status: DC | PRN
  Administered 2015-08-11 – 2015-08-12 (×2): 4 mg via INTRAVENOUS
  Filled 2015-08-11 (×2): qty 2

## 2015-08-11 MED ORDER — ONDANSETRON HCL 4 MG/2ML IJ SOLN
INTRAMUSCULAR | Status: AC
  Administered 2015-08-11: 4 mg via INTRAVENOUS
  Filled 2015-08-11: qty 2

## 2015-08-11 MED ORDER — QUINAPRIL HCL 10 MG PO TABS
20.0000 mg | ORAL_TABLET | ORAL | Status: DC
  Administered 2015-08-11 – 2015-08-16 (×6): 20 mg via ORAL
  Filled 2015-08-11 (×6): qty 2

## 2015-08-11 MED ORDER — HEPARIN SODIUM (PORCINE) 5000 UNIT/ML IJ SOLN
5000.0000 [IU] | Freq: Three times a day (TID) | INTRAMUSCULAR | Status: DC
  Administered 2015-08-11 (×2): 5000 [IU] via SUBCUTANEOUS
  Filled 2015-08-11 (×2): qty 1

## 2015-08-11 MED ORDER — MORPHINE SULFATE (PF) 2 MG/ML IV SOLN: 2.0000 mg | INTRAVENOUS | Status: DC | PRN

## 2015-08-11 MED ORDER — METOPROLOL TARTRATE 1 MG/ML IV SOLN
5.0000 mg | Freq: Once | INTRAVENOUS | Status: AC
  Administered 2015-08-11: 5 mg via INTRAVENOUS
  Filled 2015-08-11: qty 5

## 2015-08-11 MED ORDER — GABAPENTIN 300 MG PO CAPS
300.0000 mg | ORAL_CAPSULE | Freq: Three times a day (TID) | ORAL | Status: DC
  Administered 2015-08-11 – 2015-08-16 (×16): 300 mg via ORAL
  Filled 2015-08-11 (×16): qty 1

## 2015-08-11 MED ORDER — PIPERACILLIN-TAZOBACTAM 3.375 G IVPB 30 MIN
3.3750 g | Freq: Once | INTRAVENOUS | Status: AC
  Administered 2015-08-11: 3.375 g via INTRAVENOUS
  Filled 2015-08-11: qty 50

## 2015-08-11 MED ORDER — SODIUM CHLORIDE 0.9 % IV BOLUS (SEPSIS): 1000.0000 mL | INTRAVENOUS | Status: DC

## 2015-08-11 MED ORDER — INSULIN ASPART 100 UNIT/ML ~~LOC~~ SOLN
0.0000 [IU] | Freq: Three times a day (TID) | SUBCUTANEOUS | Status: DC
  Administered 2015-08-11: 15 [IU] via SUBCUTANEOUS
  Administered 2015-08-12: 4 [IU] via SUBCUTANEOUS
  Administered 2015-08-12: 3 [IU] via SUBCUTANEOUS
  Administered 2015-08-12: 5 [IU] via SUBCUTANEOUS
  Administered 2015-08-13: 3 [IU] via SUBCUTANEOUS
  Administered 2015-08-13: 4 [IU] via SUBCUTANEOUS
  Administered 2015-08-13: 7 [IU] via SUBCUTANEOUS
  Administered 2015-08-14 (×2): 3 [IU] via SUBCUTANEOUS
  Administered 2015-08-14: 4 [IU] via SUBCUTANEOUS
  Administered 2015-08-15: 7 [IU] via SUBCUTANEOUS
  Administered 2015-08-15: 3 [IU] via SUBCUTANEOUS
  Administered 2015-08-16: 4 [IU] via SUBCUTANEOUS
  Administered 2015-08-16: 3 [IU] via SUBCUTANEOUS
  Filled 2015-08-11: qty 3
  Filled 2015-08-11: qty 7
  Filled 2015-08-11: qty 3
  Filled 2015-08-11: qty 15
  Filled 2015-08-11: qty 4
  Filled 2015-08-11: qty 3
  Filled 2015-08-11: qty 5
  Filled 2015-08-11 (×2): qty 3
  Filled 2015-08-11: qty 7
  Filled 2015-08-11 (×2): qty 4

## 2015-08-11 MED ORDER — OXYCODONE HCL 5 MG PO TABS
5.0000 mg | ORAL_TABLET | ORAL | Status: DC | PRN
  Administered 2015-08-11 – 2015-08-16 (×5): 5 mg via ORAL
  Filled 2015-08-11 (×5): qty 1

## 2015-08-11 MED ORDER — SODIUM CHLORIDE 0.9 % IV BOLUS (SEPSIS)
500.0000 mL | Freq: Once | INTRAVENOUS | Status: AC
  Administered 2015-08-11: 500 mL via INTRAVENOUS

## 2015-08-11 MED ORDER — AMLODIPINE BESYLATE 5 MG PO TABS
5.0000 mg | ORAL_TABLET | Freq: Every day | ORAL | Status: DC
  Administered 2015-08-11 – 2015-08-16 (×6): 5 mg via ORAL
  Filled 2015-08-11 (×6): qty 1

## 2015-08-11 MED ORDER — HYDRALAZINE HCL 20 MG/ML IJ SOLN: 10.0000 mg | Freq: Four times a day (QID) | INTRAMUSCULAR | Status: DC | PRN

## 2015-08-11 MED ORDER — ONDANSETRON HCL 4 MG PO TABS: 4.0000 mg | ORAL_TABLET | Freq: Four times a day (QID) | ORAL | Status: DC | PRN

## 2015-08-11 MED ORDER — VANCOMYCIN HCL IN DEXTROSE 1-5 GM/200ML-% IV SOLN
1000.0000 mg | Freq: Once | INTRAVENOUS | Status: AC
  Administered 2015-08-11: 1000 mg via INTRAVENOUS
  Filled 2015-08-11: qty 200

## 2015-08-11 MED ORDER — INSULIN ASPART 100 UNIT/ML ~~LOC~~ SOLN
0.0000 [IU] | Freq: Every day | SUBCUTANEOUS | Status: DC
  Administered 2015-08-11 – 2015-08-12 (×2): 2 [IU] via SUBCUTANEOUS
  Filled 2015-08-11: qty 2
  Filled 2015-08-11: qty 4
  Filled 2015-08-11: qty 3
  Filled 2015-08-11: qty 2

## 2015-08-11 MED ORDER — PIPERACILLIN-TAZOBACTAM 3.375 G IVPB
3.3750 g | Freq: Three times a day (TID) | INTRAVENOUS | Status: DC
  Administered 2015-08-11 – 2015-08-13 (×8): 3.375 g via INTRAVENOUS
  Filled 2015-08-11 (×11): qty 50

## 2015-08-11 MED ORDER — SODIUM CHLORIDE 0.9 % IJ SOLN
3.0000 mL | Freq: Two times a day (BID) | INTRAMUSCULAR | Status: DC
  Administered 2015-08-11 – 2015-08-16 (×10): 3 mL via INTRAVENOUS

## 2015-08-11 MED ORDER — SODIUM CHLORIDE 0.9 % IV SOLN
INTRAVENOUS | Status: DC
  Administered 2015-08-11 – 2015-08-13 (×6): via INTRAVENOUS

## 2015-08-11 MED ORDER — ONDANSETRON HCL 4 MG/2ML IJ SOLN
4.0000 mg | Freq: Once | INTRAMUSCULAR | Status: AC
  Administered 2015-08-11: 4 mg via INTRAVENOUS

## 2015-08-11 NOTE — ED Notes (Signed)
Dr Beather Arbour in to speak with pt's daughter

## 2015-08-11 NOTE — ED Notes (Addendum)
Pt requests again to get up to void; assisted up to room commode but unable to urinate; pt vomited moderate amount brown emesis; assisted back to bed; pt encouraged to use bedpan with next attempt due to change in HR and nausea

## 2015-08-11 NOTE — ED Notes (Signed)
Pt assisted up on bedpan at request for BM without results

## 2015-08-11 NOTE — H&P (Signed)
[facility] [place] NAME: Yvonne Mcdaniel    MR#:  [id number]  DATE OF BIRTH:  [birth year]   DATE OF ADMISSION:  08/10/2015  PRIMARY CARE PHYSICIAN: [facility]   REQUESTING/REFERRING PHYSICIAN: Beather Arbour  CHIEF COMPLAINT:   Chief Complaint  Patient presents with  . Nausea  . Emesis    HISTORY OF PRESENT ILLNESS:  Yvonne Mcdaniel  is a 69 y.o. female with a known history of essential hypertension, hepatitis C presenting with altered mental status. The patient is unable to provide meaningful information given mental status medical condition history obtained from daughters present at bedside. They described one day duration of altered mental status with associated nausea and vomiting of nonbloody nonbilious emesis. They deny any preceding symptoms. The patient does have positive sick contacts but was complaining of no further symptoms. Upon arrival to the emergency department she is noted to be tachycardic as well as tachypneic with elevated white count thus code sepsis initiated  PAST MEDICAL HISTORY:   Past Medical History  Diagnosis Date  . Hypertension     PAST SURGICAL HISTORY:  History reviewed. No pertinent past surgical history.  SOCIAL HISTORY:   Social History  Substance Use Topics  . Smoking status: Former Research scientist (life sciences)  . Smokeless tobacco: Not on file  . Alcohol Use: No    FAMILY HISTORY:   Family History  Problem Relation Age of Onset  . Hypertension Other     DRUG ALLERGIES:  No Known Allergies  REVIEW OF SYSTEMS:  Unable to accurately obtain given patient's mental status/medical condition    MEDICATIONS AT HOME:   Prior to Admission medications   Medication Sig Start Date End Date Taking? Authorizing Provider  amLODipine (NORVASC) 5 MG tablet Take 1 tablet by mouth daily.   Yes Historical Provider, MD  hydrochlorothiazide (HYDRODIURIL) 25 MG tablet Take 1 tablet by mouth daily.   Yes Historical Provider,  MD  quinapril (ACCUPRIL) 20 MG tablet Take 1 tablet by mouth every morning.   Yes Historical Provider, MD  tiZANidine (ZANAFLEX) 2 MG tablet Take 2-4 mg by mouth every 8 (eight) hours as needed for muscle spasms.   Yes Historical Provider, MD  oxyCODONE (OXY IR/ROXICODONE) 5 MG immediate release tablet Take 1 tablet by mouth 3 (three) times daily as needed.    Historical Provider, MD      VITAL SIGNS:  Blood pressure 174/72, pulse 135, temperature 98.8 F (37.1 C), temperature source Rectal, resp. rate 26, height 5\' 5"  (1.651 m), weight 185 lb (83.915 kg), SpO2 99 %.  PHYSICAL EXAMINATION:  VITAL SIGNS: Filed Vitals:   08/11/15 0130  BP: 174/72  Pulse: 135  Temp:   Resp: 70   GENERAL:69 y.o.female currently ill-appearing, shaking tremors HEAD: Normocephalic, atraumatic.  EYES: Pupils equal, round, reactive to light. Extraocular muscles intact. No scleral icterus.  MOUTH: Moist mucosal membrane. Dentition intact. No abscess noted.  EAR, NOSE, THROAT: Clear without exudates. No external lesions.  NECK: Supple. No thyromegaly. No nodules. No JVD.  PULMONARY: Clear to ascultation, without wheeze rails or rhonci. No use of accessory muscles, Good respiratory effort. good air entry bilaterally CHEST: Nontender to palpation.  CARDIOVASCULAR: S1 and S2.tachycardic No murmurs, rubs, or gallops. No edema. Pedal pulses 2+ bilaterally.  GASTROINTESTINAL: Soft, nontender, nondistended. No masses. Positive bowel sounds. No hepatosplenomegaly.  MUSCULOSKELETAL: No swelling, clubbing, or edema. Range of motion full in all extremities.  NEUROLOGIC: Cranial nerves II through XII are intact.Generalized tremor,  no focal neurological deficit Skin: No ulceration, lesions, rashes, or cyanosis. hot to touch turgor intact.  PSYCHIATRIC: Mood, affectblunted . The patient is awake, alert and oriented x 2, slow to respond Insight, judgmenpoor CBC  Recent Labs Lab 08/10/15 2321  WBC 11.2*  HGB 12.5  HCT  37.9  PLT 130*   ------------------------------------------------------------------------------------------------------------------  Chemistries   Recent Labs Lab 08/10/15 2321  NA 140  K 3.8  CL 102  CO2 16*  GLUCOSE 338*  BUN 23*  CREATININE 1.81*  CALCIUM 9.7  AST 124*  ALT 59*  ALKPHOS 223*  BILITOT 4.2*   ------------------------------------------------------------------------------------------------------------------  Cardiac Enzymes  Recent Labs Lab 08/10/15 2321  TROPONINI 0.20*   ------------------------------------------------------------------------------------------------------------------  RADIOLOGY:  Ct Abdomen Pelvis Wo Contrast  08/11/2015   CLINICAL DATA:  69 year old female with nausea vomiting confusion. Initial encounter.  EXAM: CT ABDOMEN AND PELVIS WITHOUT CONTRAST  TECHNIQUE: Multidetector CT imaging of the abdomen and pelvis was performed following the standard protocol without IV contrast.  COMPARISON:  CT left hip 06/12/2012.  FINDINGS: Negative lung bases. No pericardial or pleural effusion. Mild cardiomegaly. Tortuous descending thoracic aorta with calcified atherosclerosis.  Widespread advanced disc and endplate degeneration in the spine. Advanced right hip joint degeneration. Left hip arthroplasty changes with streak artifact. No acute osseous abnormality identified.  No pelvic free fluid identified. Negative noncontrast uterus and adnexa. The bladder is diminutive and largely not visible. Gas and stool in the rectum. The sigmoid colon is redundant but largely decompressed. The left colon is decompressed.  Transverse colon is negative. Redundant hepatic flexure. The cecum is located in the pelvis. Normal appendix. Negative terminal ileum. No dilated small bowel. The stomach is distended with fluid. The duodenum is negative.  The liver is nodular and cirrhotic and there is a low-density right hepatic lobe lesion measuring about 2 cm on series 2, image  12. The gallbladder is surgically absent.  No splenomegaly. There is a small subcapsular low-density splenic lesion on series 2, image 24. Nearby small calcified granuloma in the spleen. Noncontrast pancreas and adrenal glands are within normal limits. No abdominal free fluid or free air. Extensive Aortoiliac calcified atherosclerosis noted. Negative non contrast right kidney and right ureter. Negative non contrast left kidney and left ureter. Mild right inguinal venous varix which tracks into the ventral abdominal wall and an 2 Zinn chair he. Most apparent on coronal images there is a large ventral abdominal VAC this tracks communicates with the liver hilum along the falciform. See coronal image 21.  IMPRESSION: 1. Cirrhotic liver with 2 cm low-density lesion in the right lobe suspicious for hepatocellular carcinoma in this setting. Correlate with AFP level. 2. 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 3. Calcified aortic atherosclerosis. Advanced degenerative changes in the spine and at the right hip.   Electronically Signed   By: Genevie Ann M.D.   On: 08/11/2015 00:50   Ct Head Wo Contrast  08/11/2015   CLINICAL DATA:  General sickness, nausea, vomiting, and confusion.  EXAM: CT HEAD WITHOUT CONTRAST  TECHNIQUE: Contiguous axial images were obtained from the base of the skull through the vertex without intravenous contrast.  COMPARISON:  MRI brain 01/29/2014.  CT head 06/12/2012.  FINDINGS: Mild cerebral atrophy. Patchy low-attenuation changes in the deep white matter consistent with small vessel ischemia. No significant ventricular dilatation. No mass effect or midline shift. No abnormal extra-axial fluid collections. Gray-white matter junctions are  distinct. Basal cisterns are not effaced. No evidence of acute intracranial hemorrhage. No depressed skull fractures. Visualized paranasal sinuses and mastoid air  cells are not opacified.  IMPRESSION: No acute intracranial abnormalities. Mild chronic atrophy and small vessel ischemic changes.   Electronically Signed   By: Lucienne Capers M.D.   On: 08/11/2015 00:45   Dg Chest Port 1 View  08/10/2015   CLINICAL DATA:  Fever, chills, nausea, vomiting, and confusion.  EXAM: PORTABLE CHEST 1 VIEW  COMPARISON:  09/03/2012  FINDINGS: Heart size and pulmonary vascularity are normal for technique. No focal airspace disease or consolidation in the lungs. No blunting of costophrenic angles. No pneumothorax. Calcified and tortuous aorta. Mediastinal contours appear intact. Degenerative changes in the shoulders.  IMPRESSION: No active disease.   Electronically Signed   By: Lucienne Capers M.D.   On: 08/10/2015 23:54    EKG:   Orders placed or performed during the hospital encounter of 08/10/15  . EKG 12-Lead  . EKG 12-Lead    IMPRESSION AND PLAN:  69 year old Caucasian female history of essential hypertension, hepatitis C presenting with altered mental status  1.Sepsis, meeting septic criteria by leukocytosis, tachycardia, respiratory rate present on arrival. Source unclear Code sepsis initiated. Panculture. Broad-spectrum antibiotics including vancomycin/Zosyn and taper antibiotics when culture data returns. He has received a 30 mL/kg IV fluid bolus. Continue IV fluid hydration to keep mean arterial pressure greater than 65. He may require pressor therapy if blood pressure worsens. We will repeat lactic acid given the initial is greater than 2.2.  2. Anion gap metabolic acidosis/lactic acidosis: Question if this related to liver disease as she is currently perfusing well, repeat lactic acid every 3 hours, maintain mean arterial pressure greater than 65 3. Acute kidney injury: IV fluid hydration, follow urine output/renal function 4. Elevated troponin: Place on telemetry trend cardiac enzymes 5. 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 Venous thrombi embolism prophylactic: Heparin subcutaneous    the records are reviewed and case discussed with ED provider. Management plans discussed with the patient, family and they are in agreement.  CODE STATUS: Full  TOTAL TIME TAKING CARE OF THIS PATIENT: 55  minutes.    Luz Mares,  Karenann Cai.D on 08/11/2015 at 1:51 AM  Between 7am to 6pm - Pager - [phone]  After 6pm: House Pager: - [phone]  [facility]  Office  [phone]  CC: Primary care physician; [facility]

## 2015-08-11 NOTE — ED Notes (Signed)
Pt vomiting small amount brown emesis; med admin as ordered

## 2015-08-11 NOTE — ED Notes (Signed)
Pt to CT via stretcher

## 2015-08-11 NOTE — Care Management Important Message (Signed)
Important Message  Patient Details  Name: BATYA CITRON MRN: [id number] Date of Birth: [birth year]   Medicare Important Message Given:  Yes-second notification given    Allysa Governale A, RN 08/11/2015, 9:04 AM

## 2015-08-11 NOTE — Progress Notes (Addendum)
Patient is admitted to room 254 with a diagnosis of sepsis. Alert and oriented but sometimes confused. Patient denied any acute pain. NS running at 125 cc/hr, no issues. Skin assessment done with Leonette Monarch RN. Noted redness on the rectum, redness on  bilateral groin, and left under breast, Bruises to bilateral arms, and circumferential brown dischronation to the bilateral lower extremities. Also noted old scar to the L. Lower back and cervical spine. No respiratory distress noted. Will continue to monitor.

## 2015-08-11 NOTE — Care Management Note (Signed)
Case Management Note  Patient Details  Name: NATHANIA WALDMAN MRN: [id number] Date of Birth: [birth year]  Subjective/Objective:   69yo Ms Larissa Pegg was admitted from home on 08/11/15 per persistent nausea and vomiting. White blood count elevated and has increased heart rate and respiratory rate and is diagnosed with sepsis. IV antibiotics. Hx Hep-C and HTN. Care management will follow for discharge planning and will evaluate Ms Ryner further when she is feeling more like talking.                 Action/Plan:   Expected Discharge Date:                  Expected Discharge Plan:     In-House Referral:     Discharge planning Services     Post Acute Care Choice:    Choice offered to:     DME Arranged:    DME Agency:     HH Arranged:    [place] Agency:     Status of Service:     Medicare Important Message Given:  Yes-second notification given Date Medicare IM Given:    Medicare IM give by:    Date Additional Medicare IM Given:    Additional Medicare Important Message give by:     If discussed at [place] of Stay Meetings, dates discussed:    Additional Comments:  Anastashia Westerfeld A, RN 08/11/2015, 4:21 PM

## 2015-08-11 NOTE — Progress Notes (Signed)
ANTIBIOTIC CONSULT NOTE - INITIAL  Pharmacy Consult for vancomycin and zosyn dosing Indication: sepsis  No Known Allergies  Patient Measurements: Height: 5\' 5"  (165.1 cm) Weight: 185 lb (83.915 kg) IBW/kg (Calculated) : 57 Adjusted Body Weight: 68kg  Vital Signs: Temp: 98.8 F (37.1 C) (10/09 2320) Temp Source: Rectal (10/09 2320) BP: 174/72 mmHg (10/10 0130) Pulse Rate: 135 (10/10 0130) Intake/Output from previous day: 10/09 0701 - 10/10 0700 In: -  Out: 50 [Urine:50] Intake/Output from this shift: Total I/O In: -  Out: 50 [Urine:50]  Labs:  Recent Labs  08/10/15 2321  WBC 11.2*  HGB 12.5  PLT 130*  CREATININE 1.81*   Estimated Creatinine Clearance: 32.3 mL/min (by C-G formula based on Cr of 1.81). No results for input(s): VANCOTROUGH, VANCOPEAK, VANCORANDOM, GENTTROUGH, GENTPEAK, GENTRANDOM, TOBRATROUGH, TOBRAPEAK, TOBRARND, AMIKACINPEAK, AMIKACINTROU, AMIKACIN in the last 72 hours.   Microbiology: No results found for this or any previous visit (from the past 720 hour(s)).  Medical History: Past Medical History  Diagnosis Date  . Hypertension     Medications:   Assessment: Blood and urine cx pending UA: (-) CXR: no active disease  Goal of Therapy:  Vancomycin trough level 15-20 mcg/ml  Plan:  TBW 83.9kg  IBW 57kg  DW 68kg  Vd 48L kei 0.031 hr-1  t1/2 22 hours Vancomycin 1 gram q 24 hours ordered with stacked dosing. Level before 5th dose.   Zosyn 3.375 grams q 8 hours ordered.  Carmine Youngberg S 08/11/2015,1:46 AM

## 2015-08-11 NOTE — ED Notes (Signed)
CT notified pt ready for CT.

## 2015-08-11 NOTE — Progress Notes (Signed)
[facility] NAME: Yvonne Mcdaniel    MR#:  [id number]  DATE OF BIRTH:  16-Oct-1946  SUBJECTIVE:  Poor historian. Came in with AMS and vomiting. Shivering. low grade fever Answers all questions appropriately dter in the room. Denies abdominal pain  REVIEW OF SYSTEMS:   Review of Systems  Constitutional: Positive for fever. Negative for chills and weight loss.  HENT: Negative for ear discharge, ear pain and nosebleeds.   Eyes: Negative for blurred vision, pain and discharge.  Respiratory: Negative for sputum production, shortness of breath, wheezing and stridor.   Cardiovascular: Negative for chest pain, palpitations, orthopnea and PND.  Gastrointestinal: Negative for nausea, vomiting, abdominal pain and diarrhea.  Genitourinary: Negative for urgency and frequency.  Musculoskeletal: Negative for back pain and joint pain.  Neurological: Positive for weakness. Negative for sensory change, speech change and focal weakness.  Psychiatric/Behavioral: Negative for depression and hallucinations. The patient is not nervous/anxious.   All other systems reviewed and are negative.  Tolerating Diet:CLD Tolerating PT: eval pending  DRUG ALLERGIES:  No Known Allergies  VITALS:  Blood pressure 145/67, pulse 114, temperature 99.1 F (37.3 C), temperature source Oral, resp. rate 22, height 5\' 5"  (1.651 m), weight 87.907 kg (193 lb 12.8 oz), SpO2 98 %.  PHYSICAL EXAMINATION:   Physical Exam  GENERAL:  69 y.o.-year-old patient lying in the bed with no acute distress.ill appearing  EYES: Pupils equal, round, reactive to light and accommodation. No scleral icterus. Extraocular muscles intact.  HEENT: Head atraumatic, normocephalic. Oropharynx and nasopharynx clear.  NECK:  Supple, no jugular venous distention. No thyroid enlargement, no tenderness.  LUNGS: Normal breath sounds bilaterally, no wheezing, rales, rhonchi. No use of accessory  muscles of respiration.  CARDIOVASCULAR: S1, S2 normal. No murmurs, rubs, or gallops.  ABDOMEN: Soft, nontender, nondistended. Bowel sounds present. No organomegaly or mass.  EXTREMITIES: No cyanosis, clubbing or edema b/l.    NEUROLOGIC: Cranial nerves II through XII are intact. No focal Motor or sensory deficits b/l.   PSYCHIATRIC: The patient is alert and oriented x 2.  SKIN: No obvious rash, lesion, or ulcer.    LABORATORY PANEL:   CBC  Recent Labs Lab 08/11/15 0408  WBC 11.6*  HGB 11.8*  HCT 34.5*  PLT 105*    Chemistries   Recent Labs Lab 08/11/15 0408  NA 141  K 3.7  CL 108  CO2 19*  GLUCOSE 354*  BUN 22*  CREATININE 1.56*  CALCIUM 8.8*  AST 105*  ALT 54  ALKPHOS 195*  BILITOT 3.6*    Cardiac Enzymes  Recent Labs Lab 08/10/15 2321  TROPONINI 0.20*    RADIOLOGY:  Ct Abdomen Pelvis Wo Contrast  08/11/2015   CLINICAL DATA:  69 year old female with nausea vomiting confusion. Initial encounter.  EXAM: CT ABDOMEN AND PELVIS WITHOUT CONTRAST  TECHNIQUE: Multidetector CT imaging of the abdomen and pelvis was performed following the standard protocol without IV contrast.  COMPARISON:  CT left hip 06/12/2012.  FINDINGS: Negative lung bases. No pericardial or pleural effusion. Mild cardiomegaly. Tortuous descending thoracic aorta with calcified atherosclerosis.  Widespread advanced disc and endplate degeneration in the spine. Advanced right hip joint degeneration. Left hip arthroplasty changes with streak artifact. No acute osseous abnormality identified.  No pelvic free fluid identified. Negative noncontrast uterus and adnexa. The bladder is diminutive and largely not visible. Gas and stool in the rectum. The sigmoid colon is redundant but largely decompressed. The left colon is decompressed.  Transverse colon is negative. Redundant hepatic flexure. The cecum is located in the pelvis. Normal appendix. Negative terminal ileum. No dilated small bowel. The stomach is  distended with fluid. The duodenum is negative.  The liver is nodular and cirrhotic and there is a low-density right hepatic lobe lesion measuring about 2 cm on series 2, image 12. The gallbladder is surgically absent.  No splenomegaly. There is a small subcapsular low-density splenic lesion on series 2, image 24. Nearby small calcified granuloma in the spleen. Noncontrast pancreas and adrenal glands are within normal limits. No abdominal free fluid or free air. Extensive Aortoiliac calcified atherosclerosis noted. Negative non contrast right kidney and right ureter. Negative non contrast left kidney and left ureter. Mild right inguinal venous varix which tracks into the ventral abdominal wall and an 2 Zinn chair he. Most apparent on coronal images there is a large ventral abdominal VAC this tracks communicates with the liver hilum along the falciform. See coronal image 21.  IMPRESSION: 1. Cirrhotic liver with 2 cm low-density lesion in the right lobe suspicious for hepatocellular carcinoma in this setting. Correlate with AFP level. 2. 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 3. Calcified aortic atherosclerosis. Advanced degenerative changes in the spine and at the right hip.   Electronically Signed   By: Genevie Ann M.D.   On: 08/11/2015 00:50   Ct Head Wo Contrast  08/11/2015   CLINICAL DATA:  General sickness, nausea, vomiting, and confusion.  EXAM: CT HEAD WITHOUT CONTRAST  TECHNIQUE: Contiguous axial images were obtained from the base of the skull through the vertex without intravenous contrast.  COMPARISON:  MRI brain 01/29/2014.  CT head 06/12/2012.  FINDINGS: Mild cerebral atrophy. Patchy low-attenuation changes in the deep white matter consistent with small vessel ischemia. No significant ventricular dilatation. No mass effect or midline shift. No abnormal extra-axial fluid collections. Gray-white matter  junctions are distinct. Basal cisterns are not effaced. No evidence of acute intracranial hemorrhage. No depressed skull fractures. Visualized paranasal sinuses and mastoid air cells are not opacified.  IMPRESSION: No acute intracranial abnormalities. Mild chronic atrophy and small vessel ischemic changes.   Electronically Signed   By: Lucienne Capers M.D.   On: 08/11/2015 00:45   Dg Chest Port 1 View  08/10/2015   CLINICAL DATA:  Fever, chills, nausea, vomiting, and confusion.  EXAM: PORTABLE CHEST 1 VIEW  COMPARISON:  09/03/2012  FINDINGS: Heart size and pulmonary vascularity are normal for technique. No focal airspace disease or consolidation in the lungs. No blunting of costophrenic angles. No pneumothorax. Calcified and tortuous aorta. Mediastinal contours appear intact. Degenerative changes in the shoulders.  IMPRESSION: No active disease.   Electronically Signed   By: Lucienne Capers M.D.   On: 08/10/2015 23:54     ASSESSMENT AND PLAN:   69 year old Caucasian female history of essential hypertension, hepatitis C presenting with altered mental status  1.Sepsis, meeting septic criteria by leukocytosis, tachycardia, respiratory rate present on arrival. Source unclear  -f/u Panculture. - Broad-spectrum antibiotics including vancomycin/Zosyn and taper antibiotics when culture data returns. -  Continue IV fluid hydration to keep mean arterial pressure greater than 65.    2. Anion gap metabolic acidosis/lactic acidosis: Question if this related to liver disease as she is currently perfusing well, repeat lactic acid down to 8.5, maintain mean arterial pressure greater than 65  3. Acute kidney injury: IV fluid hydration, follow urine output/renal function -1.86--1.51  4. Elevated troponin:trend cardiac enzymes -no cardiac history. denies cp Could be false positive due to renal failure  5.Chronic Hepatitis C, abnormal liver function tests and liver lesion suspicious for Hepatocelluar  carcinoma -seen by GI-->rec oncology consult -AFP pending -had been evaluated at [facility] and was not recommended and rx for hep C -ammonia elevated at 36. Mentation stable. Consider lactulose  6.Acquired Coagulopathy, CT evidence of cirrhosis of liver and large vent abd wall Varices -reviewed CT results with Dr dew. Nothing can be done with this large varix. Family should be aware that pt can bleed severely if attempted any abdominal surgery given the severity of the enlarged vessels.  7. Venous thrombi embolism prophylactic: SCD/TEDS Pt has acquired coagulopathy due to liver dz  8.Hyperglycemia/suspect new onset DM-2 Check A1c -SSI for now  Case discussed with Care Management/Social Worker. Management plans discussed with the patient, family and they are in agreement.  CODE STATUS: FULL  DVT Prophylaxis: SCD/TEDS  TOTAL TIME TAKING CARE OF THIS PATIENT: 40 minutes.  >50% time spent on counselling and coordination of care with pt and dter in the room D/w dr Lucky Cowboy CT abd results  POSSIBLE D/C IN? DAYS, DEPENDING ON CLINICAL CONDITION.   Mayline Dragon M.D on 08/11/2015   Between 7am to 6pm - Pager - [phone]  After 6pm go to [email or web address] - password [email or web address] [facility]  Office  [phone]  CC: Primary care physician; [facility]

## 2015-08-11 NOTE — ED Notes (Signed)
Dr Lavetta Nielsen in to see pt

## 2015-08-11 NOTE — ED Notes (Signed)
Returned to room from CT. 

## 2015-08-11 NOTE — ED Notes (Signed)
Dr Beather Arbour in to speak with family regarding plan of care

## 2015-08-11 NOTE — Progress Notes (Signed)
Inpatient Diabetes Program Recommendations  AACE/ADA: New Consensus Statement on Inpatient Glycemic Control (2015)  Target Ranges:  Prepandial:   less than 140 mg/dL      Peak postprandial:   less than 180 mg/dL (1-2 hours)      Critically ill patients:  140 - 180 mg/dL  Results for JASNEET, SCHOBERT (MRN [id number]) as of 08/11/2015 11:17  Ref. Range 08/10/2015 23:21 08/11/2015 04:08  Glucose Latest Ref Range: 65-99 mg/dL 338 (H) 354 (H)   Review of Glycemic Control  Diabetes history: No Outpatient Diabetes medications: NA Current orders for Inpatient glycemic control: None  Inpatient Diabetes Program Recommendations: Correction (SSI): Noted lab glucose 354 mg/dl today at 4:08 am. Please order CBGs with Novolog correction scale Q4H. HgbA1C: Please consider ordering an A1C to evaluate glycemic control over the past 2-3 months. Diet: Please add Carb Modified to current diet.  NOTE: Patient does not have a documented history of diabetes. Noted initial lab glucose 338 mg/dl on 10/9 and glucose 354 mg/dl this morning on labs.   Thanks, Barnie Alderman, RN, MSN, CCRN, CDE Diabetes Coordinator Inpatient Diabetes Program [phone] (Team Pager from [place] to [place]) [phone] ([place] office) [phone] [facility] office) [phone] [facility] office)

## 2015-08-11 NOTE — Consult Note (Signed)
[facility]  [address], [place], [place] [address] Phone: [phone] Fax : [phone]  Consultation  Referring Provider:     No ref. provider found Primary Care Physician:  [place] Primary Gastroenterologist:  None          Reason for Consultation:     Nausea and vomiting with cirrhosis and a liver lesion.  Date of Admission:  08/10/2015 Date of Consultation:  08/11/2015         HPI:   Yvonne Mcdaniel is a 69 y.o. female who comes in with a history of hepatitis C who was diagnosed in 2001. The patient states that she got it from a blood transfusion in the 1970s. The patient now reports that just prior to being admitted she had nausea and vomiting that was persistent. The patient was also found to have an elevated white cell count increased heart rate consistent with sepsis. The patient has been put on antibiotics and states that she is feeling much better. The patient reports that she has never been treated for her hepatitis C. The patient did undergo a CT scan of the abdomen which showed a lesion has approximately 2 cm in size that was in the liver and consistent with hepatocellular carcinoma. He shouldn't denies being under surveillance for possible HCC. She denies any abdominal pain but does report that she had some fevers and chills prior to being admitted. There is no report of any unexplained weight loss. The patient is accompanied by a son and daughter in her room when I spoke to today.  Past Medical History  Diagnosis Date  . Hypertension     History reviewed. No pertinent past surgical history.  Prior to Admission medications   Medication Sig Start Date End Date Taking? Authorizing Provider  amLODipine (NORVASC) 5 MG tablet Take 1 tablet by mouth daily.   Yes Historical Provider, MD  hydrochlorothiazide (HYDRODIURIL) 25 MG tablet Take 1 tablet by mouth daily.   Yes Historical Provider, MD  quinapril (ACCUPRIL) 20 MG tablet Take 1 tablet by  mouth every morning.   Yes Historical Provider, MD  tiZANidine (ZANAFLEX) 2 MG tablet Take 2-4 mg by mouth every 8 (eight) hours as needed for muscle spasms.   Yes Historical Provider, MD  oxyCODONE (OXY IR/ROXICODONE) 5 MG immediate release tablet Take 1 tablet by mouth 3 (three) times daily as needed.    Historical Provider, MD    Family History  Problem Relation Age of Onset  . Hypertension Other      Social History  Substance Use Topics  . Smoking status: Former Research scientist (life sciences)  . Smokeless tobacco: None  . Alcohol Use: No    Allergies as of 08/10/2015  . (No Known Allergies)    Review of Systems:    All systems reviewed and negative except where noted in HPI.   Physical Exam:  Vital signs in last 24 hours: Temp:  [98.5 F (36.9 C)-99.1 F (37.3 C)] 99.1 F (37.3 C) (10/10 1648) Pulse Rate:  [114-139] 114 (10/10 1648) Resp:  [18-25] 22 (10/10 1648) BP: (145-187)/(57-136) 145/67 mmHg (10/10 1648) SpO2:  [90 %-100 %] 98 % (10/10 1648) Weight:  [160 lb (72.576 kg)-193 lb 12.8 oz (87.907 kg)] 193 lb 12.8 oz (87.907 kg) (10/10 0317) Last BM Date: 08/10/15 General:   Pleasant, cooperative in NAD Head:  Normocephalic and atraumatic. Eyes:   No icterus.   Conjunctiva pink. PERRLA. Ears:  Normal auditory acuity. Neck:  Supple; no masses or thyroidomegaly  Lungs: Respirations even and unlabored. Lungs clear to auscultation bilaterally.   No wheezes, crackles, or rhonchi.  Heart:  Regular rate and rhythm;  Without murmur, clicks, rubs or gallops Abdomen:  Soft, nondistended, nontender. Normal bowel sounds. No appreciable masses or hepatomegaly.  No rebound or guarding.  Rectal:  Not performed. Msk:  Symmetrical without gross deformities.  Strength  Extremities:  Without edema, cyanosis or clubbing. Neurologic:  Alert and oriented x3;  grossly normal neurologically. Skin:  Intact without significant lesions or rashes. Cervical Nodes:  No significant cervical adenopathy. Psych:  Alert  and cooperative. Normal affect.  LAB RESULTS:  Recent Labs  08/10/15 2321 08/11/15 0408  WBC 11.2* 11.6*  HGB 12.5 11.8*  HCT 37.9 34.5*  PLT 130* 105*   BMET  Recent Labs  08/10/15 2321 08/11/15 0408  NA 140 141  K 3.8 3.7  CL 102 108  CO2 16* 19*  GLUCOSE 338* 354*  BUN 23* 22*  CREATININE 1.81* 1.56*  CALCIUM 9.7 8.8*   LFT  Recent Labs  08/11/15 0408  PROT 7.1  ALBUMIN 2.6*  AST 105*  ALT 54  ALKPHOS 195*  BILITOT 3.6*   PT/INR  Recent Labs  08/11/15 0252  LABPROT 20.7*  INR 1.76    STUDIES: Ct Abdomen Pelvis Wo Contrast  08/11/2015   CLINICAL DATA:  69 year old female with nausea vomiting confusion. Initial encounter.  EXAM: CT ABDOMEN AND PELVIS WITHOUT CONTRAST  TECHNIQUE: Multidetector CT imaging of the abdomen and pelvis was performed following the standard protocol without IV contrast.  COMPARISON:  CT left hip 06/12/2012.  FINDINGS: Negative lung bases. No pericardial or pleural effusion. Mild cardiomegaly. Tortuous descending thoracic aorta with calcified atherosclerosis.  Widespread advanced disc and endplate degeneration in the spine. Advanced right hip joint degeneration. Left hip arthroplasty changes with streak artifact. No acute osseous abnormality identified.  No pelvic free fluid identified. Negative noncontrast uterus and adnexa. The bladder is diminutive and largely not visible. Gas and stool in the rectum. The sigmoid colon is redundant but largely decompressed. The left colon is decompressed.  Transverse colon is negative. Redundant hepatic flexure. The cecum is located in the pelvis. Normal appendix. Negative terminal ileum. No dilated small bowel. The stomach is distended with fluid. The duodenum is negative.  The liver is nodular and cirrhotic and there is a low-density right hepatic lobe lesion measuring about 2 cm on series 2, image 12. The gallbladder is surgically absent.  No splenomegaly. There is a small subcapsular low-density  splenic lesion on series 2, image 24. Nearby small calcified granuloma in the spleen. Noncontrast pancreas and adrenal glands are within normal limits. No abdominal free fluid or free air. Extensive Aortoiliac calcified atherosclerosis noted. Negative non contrast right kidney and right ureter. Negative non contrast left kidney and left ureter. Mild right inguinal venous varix which tracks into the ventral abdominal wall and an 2 Zinn chair he. Most apparent on coronal images there is a large ventral abdominal VAC this tracks communicates with the liver hilum along the falciform. See coronal image 21.  IMPRESSION: 1. Cirrhotic liver with 2 cm low-density lesion in the right lobe suspicious for hepatocellular carcinoma in this setting. Correlate with AFP level. 2. 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 3. Calcified aortic atherosclerosis. Advanced degenerative changes in the spine and at the right hip.   Electronically Signed   By: Herminio Heads.D.  On: 08/11/2015 00:50   Ct Head Wo Contrast  08/11/2015   CLINICAL DATA:  General sickness, nausea, vomiting, and confusion.  EXAM: CT HEAD WITHOUT CONTRAST  TECHNIQUE: Contiguous axial images were obtained from the base of the skull through the vertex without intravenous contrast.  COMPARISON:  MRI brain 01/29/2014.  CT head 06/12/2012.  FINDINGS: Mild cerebral atrophy. Patchy low-attenuation changes in the deep white matter consistent with small vessel ischemia. No significant ventricular dilatation. No mass effect or midline shift. No abnormal extra-axial fluid collections. Gray-white matter junctions are distinct. Basal cisterns are not effaced. No evidence of acute intracranial hemorrhage. No depressed skull fractures. Visualized paranasal sinuses and mastoid air cells are not opacified.  IMPRESSION: No acute intracranial abnormalities. Mild chronic atrophy and  small vessel ischemic changes.   Electronically Signed   By: Lucienne Capers M.D.   On: 08/11/2015 00:45   Dg Chest Port 1 View  08/10/2015   CLINICAL DATA:  Fever, chills, nausea, vomiting, and confusion.  EXAM: PORTABLE CHEST 1 VIEW  COMPARISON:  09/03/2012  FINDINGS: Heart size and pulmonary vascularity are normal for technique. No focal airspace disease or consolidation in the lungs. No blunting of costophrenic angles. No pneumothorax. Calcified and tortuous aorta. Mediastinal contours appear intact. Degenerative changes in the shoulders.  IMPRESSION: No active disease.   Electronically Signed   By: Lucienne Capers M.D.   On: 08/10/2015 23:54      Impression / Plan:   Yvonne Mcdaniel is a 69 y.o. y/o female with who has a history of hepatitis C with cirrhosis and a CT scan showing a lesion in the liver consistent with possible hepatocellular carcinoma. The patient and her family have been explained the findings and likelihood that this is hepatocellular carcinoma. The patient will have her alpha-fetoprotein sent off and if elevated should be considered for possible resection versus chemoembolization at [facility]. I will consult oncology to also see the patient. The patient has been explained the plan and agrees with it.  Thank you for involving me in the care of this patient.      LOS: 0 days   Ollen Bowl, MD  08/11/2015, 6:31 PM   Note: This dictation was prepared with Dragon dictation along with smaller phrase technology. Any transcriptional errors that result from this process are unintentional.

## 2015-08-12 LAB — GLUCOSE, CAPILLARY
GLUCOSE-CAPILLARY: 164 mg/dL — AB (ref 65–99)
GLUCOSE-CAPILLARY: 211 mg/dL — AB (ref 65–99)
Glucose-Capillary: 192 mg/dL — ABNORMAL HIGH (ref 65–99)
Glucose-Capillary: 205 mg/dL — ABNORMAL HIGH (ref 65–99)

## 2015-08-12 LAB — URINE CULTURE
Culture: NO GROWTH
Special Requests: NORMAL

## 2015-08-12 LAB — TROPONIN I
TROPONIN I: 0.47 ng/mL — AB (ref <0.031)
TROPONIN I: 0.65 ng/mL — AB (ref <0.031)
Troponin I: 0.3 ng/mL — ABNORMAL HIGH (ref <0.031)

## 2015-08-12 LAB — HEMOGLOBIN A1C: Hgb A1c MFr Bld: 7.2 % — ABNORMAL HIGH (ref 4.0–6.0)

## 2015-08-12 LAB — AFP TUMOR MARKER: AFP-Tumor Marker: 9 ng/mL — ABNORMAL HIGH (ref 0.0–8.3)

## 2015-08-12 MED ORDER — INSULIN GLARGINE 100 UNIT/ML ~~LOC~~ SOLN
15.0000 [IU] | Freq: Every day | SUBCUTANEOUS | Status: DC
  Administered 2015-08-12 – 2015-08-15 (×4): 15 [IU] via SUBCUTANEOUS
  Filled 2015-08-12 (×5): qty 0.15

## 2015-08-12 NOTE — Progress Notes (Signed)
[facility] NAME: Yvonne Mcdaniel    MR#:  [id number]  DATE OF BIRTH:  [birth year]  SUBJECTIVE:   Patient here due to altered mental status and suspected sepsis. Mental status much improved today. Daughter at bedside. Tachycardia has improved. Afebrile, hemodynamically stable. No nausea, vomiting, diarrhea.  REVIEW OF SYSTEMS:   Review of Systems  Constitutional: Negative for fever, chills and weight loss.  HENT: Negative for ear discharge, ear pain and nosebleeds.   Eyes: Negative for blurred vision, pain and discharge.  Respiratory: Negative for sputum production, shortness of breath, wheezing and stridor.   Cardiovascular: Negative for chest pain, palpitations, orthopnea and PND.  Gastrointestinal: Negative for nausea, vomiting, abdominal pain and diarrhea.  Genitourinary: Negative for urgency and frequency.  Musculoskeletal: Negative for back pain and joint pain.  Neurological: Positive for weakness (generalized). Negative for sensory change, speech change and focal weakness.  Psychiatric/Behavioral: Negative for depression and hallucinations. The patient is not nervous/anxious.   All other systems reviewed and are negative.  Tolerating Diet: CLD Tolerating PT: Await evaluation  DRUG ALLERGIES:  No Known Allergies  VITALS:  Blood pressure 142/69, pulse 110, temperature 98.3 F (36.8 C), temperature source Oral, resp. rate 18, height 5' 5" (1.651 m), weight 87.907 kg (193 lb 12.8 oz), SpO2 97 %.  PHYSICAL EXAMINATION:   Physical Exam  GENERAL:  69 y.o.-year-old patient lying in the bed with no acute distress EYES: Pupils equal, round, reactive to light and accommodation. No scleral icterus. Extraocular muscles intact.  HEENT: Head atraumatic, normocephalic. Oropharynx and nasopharynx clear.  NECK:  Supple, no jugular venous distention. No thyroid enlargement, no tenderness.  LUNGS: Normal breath sounds bilaterally, no  wheezing, rales, rhonchi. No use of accessory muscles of respiration.  CARDIOVASCULAR: S1, S2 tachycardic. No murmurs, rubs, or gallops.  ABDOMEN: Soft, nontender, nondistended. Bowel sounds present. No organomegaly or mass.  EXTREMITIES: No cyanosis, clubbing or edema b/l.    NEUROLOGIC: Cranial nerves II through XII are intact. No focal Motor or sensory deficits b/l.  Globally weak PSYCHIATRIC: The patient is alert and oriented x 2.  SKIN: No obvious rash, lesion, or ulcer.    LABORATORY PANEL:   CBC  Recent Labs Lab 08/11/15 0408  WBC 11.6*  HGB 11.8*  HCT 34.5*  PLT 105*    Chemistries   Recent Labs Lab 08/11/15 0408  NA 141  K 3.7  CL 108  CO2 19*  GLUCOSE 354*  BUN 22*  CREATININE 1.56*  CALCIUM 8.8*  AST 105*  ALT 54  ALKPHOS 195*  BILITOT 3.6*    Cardiac Enzymes  Recent Labs Lab 08/12/15 1230  TROPONINI 0.30*    RADIOLOGY:  Ct Abdomen Pelvis Wo Contrast  08/11/2015   CLINICAL DATA:  69 year old female with nausea vomiting confusion. Initial encounter.  EXAM: CT ABDOMEN AND PELVIS WITHOUT CONTRAST  TECHNIQUE: Multidetector CT imaging of the abdomen and pelvis was performed following the standard protocol without IV contrast.  COMPARISON:  CT left hip 06/12/2012.  FINDINGS: Negative lung bases. No pericardial or pleural effusion. Mild cardiomegaly. Tortuous descending thoracic aorta with calcified atherosclerosis.  Widespread advanced disc and endplate degeneration in the spine. Advanced right hip joint degeneration. Left hip arthroplasty changes with streak artifact. No acute osseous abnormality identified.  No pelvic free fluid identified. Negative noncontrast uterus and adnexa. The bladder is diminutive and largely not visible. Gas and stool in the rectum. The sigmoid colon is redundant but largely decompressed.  The left colon is decompressed.  Transverse colon is negative. Redundant hepatic flexure. The cecum is located in the pelvis. Normal appendix.  Negative terminal ileum. No dilated small bowel. The stomach is distended with fluid. The duodenum is negative.  The liver is nodular and cirrhotic and there is a low-density right hepatic lobe lesion measuring about 2 cm on series 2, image 12. The gallbladder is surgically absent.  No splenomegaly. There is a small subcapsular low-density splenic lesion on series 2, image 24. Nearby small calcified granuloma in the spleen. Noncontrast pancreas and adrenal glands are within normal limits. No abdominal free fluid or free air. Extensive Aortoiliac calcified atherosclerosis noted. Negative non contrast right kidney and right ureter. Negative non contrast left kidney and left ureter. Mild right inguinal venous varix which tracks into the ventral abdominal wall and an 2 Zinn chair he. Most apparent on coronal images there is a large ventral abdominal VAC this tracks communicates with the liver hilum along the falciform. See coronal image 21.  IMPRESSION: 1. Cirrhotic liver with 2 cm low-density lesion in the right lobe suspicious for hepatocellular carcinoma in this setting. Correlate with AFP level. 2. 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 3. Calcified aortic atherosclerosis. Advanced degenerative changes in the spine and at the right hip.   Electronically Signed   By: Genevie Ann M.D.   On: 08/11/2015 00:50   Ct Head Wo Contrast  08/11/2015   CLINICAL DATA:  General sickness, nausea, vomiting, and confusion.  EXAM: CT HEAD WITHOUT CONTRAST  TECHNIQUE: Contiguous axial images were obtained from the base of the skull through the vertex without intravenous contrast.  COMPARISON:  MRI brain 01/29/2014.  CT head 06/12/2012.  FINDINGS: Mild cerebral atrophy. Patchy low-attenuation changes in the deep white matter consistent with small vessel ischemia. No significant ventricular dilatation. No mass effect or midline  shift. No abnormal extra-axial fluid collections. Gray-white matter junctions are distinct. Basal cisterns are not effaced. No evidence of acute intracranial hemorrhage. No depressed skull fractures. Visualized paranasal sinuses and mastoid air cells are not opacified.  IMPRESSION: No acute intracranial abnormalities. Mild chronic atrophy and small vessel ischemic changes.   Electronically Signed   By: Lucienne Capers M.D.   On: 08/11/2015 00:45   Dg Chest Port 1 View  08/10/2015   CLINICAL DATA:  Fever, chills, nausea, vomiting, and confusion.  EXAM: PORTABLE CHEST 1 VIEW  COMPARISON:  09/03/2012  FINDINGS: Heart size and pulmonary vascularity are normal for technique. No focal airspace disease or consolidation in the lungs. No blunting of costophrenic angles. No pneumothorax. Calcified and tortuous aorta. Mediastinal contours appear intact. Degenerative changes in the shoulders.  IMPRESSION: No active disease.   Electronically Signed   By: Lucienne Capers M.D.   On: 08/10/2015 23:54     ASSESSMENT AND PLAN:   69 year old Caucasian female history of essential hypertension, hepatitis C presenting with altered mental status  #1.Sepsis-patient met criteria as she presented with tachycardia, leukocytosis and tachypnea. -Source still unclear. Currently afebrile, hemodynamically stable. - Continue broad-spectrum IV antibiotics with vancomycin, Zosyn. Follow blood, sputum, urine cultures  #2. Anion gap metabolic acidosis/lactic acidosis: This is likely D lactic acidosis secondary to Liver disease.  No evidence of hypotension and end-organ damage or hypoperfusion.   #3. Acute kidney injury: Improved with IV fluids and will monitor  #4. Elevated troponin: Likely in the setting of demand ischemia from tachycardia and  possible underlying sepsis. -No chest pain and noted an acute coronary syndrome.  #5.Chronic Hepatitis C, abnormal liver function tests and liver lesion suspicious for Hepatocelluar  carcinoma -Alpha-fetoprotein slightly elevated. Appreciate gastroenterology input oncology has been consulted. -had been evaluated at [facility] and was not recommended and rx for hep C -No evidence of hepatic encephalopathy. Consider lactulose  #6.Acquired Coagulopathy, CT evidence of cirrhosis of liver and large vent abd wall Varices Nothing can be done with this large varix. Family made aware that pt can bleed severely if attempted any abdominal surgery given the severity of the enlarged vessels.  7#.Hyperglycemia/suspect new onset DM-2 - will check HemoglobinA1c.  - start on low dose Lantus, cont. SSI.   #8 Neuropathy - cont. Neurontin.   Case discussed with Care Management/Social Worker. Management plans discussed with the patient, family and they are in agreement.  CODE STATUS: FULL  DVT Prophylaxis: SCD/TEDS  TOTAL TIME TAKING CARE OF THIS PATIENT: 30 minutes.   POSSIBLE D/C IN? DAYS, DEPENDING ON CLINICAL CONDITION.   Henreitta Leber M.D on 08/12/2015   Between 7am to 6pm - Pager - [phone]  After 6pm go to [email or web address] - password [email or web address] [facility]  Office  [phone]  CC: Primary care physician; [facility]

## 2015-08-12 NOTE — Progress Notes (Signed)
Noted dressing to buttocks for prevention

## 2015-08-12 NOTE — Progress Notes (Signed)
Inpatient Diabetes Program Recommendations  AACE/ADA: New Consensus Statement on Inpatient Glycemic Control (2015)  Target Ranges:  Prepandial:   less than 140 mg/dL      Peak postprandial:   less than 180 mg/dL (1-2 hours)      Critically ill patients:  140 - 180 mg/dL   Review of Glycemic Control  Results for Yvonne Mcdaniel, Yvonne Mcdaniel (MRN [id number]) as of 08/12/2015 09:30  Ref. Range 08/11/2015 16:41 08/11/2015 21:33 08/12/2015 07:38  Glucose-Capillary Latest Ref Range: 65-99 mg/dL 304 (H) 206 (H) 164 (H)    Diabetes history: No Outpatient Diabetes medications: None Current orders for Inpatient glycemic control: Novolog 0-20 units tid, Novolog 0-5 units  Inpatient Diabetes Program Recommendations:  A1C pending.  Agree with current orders for Novolog insulin.   Dietitian has modified current clear liquids to include sugar free jello and popsicles.    Gentry Fitz, RN, BA, MHA, CDE Diabetes Coordinator Inpatient Diabetes Program  [phone] (Team Pager) [phone] ([place]) 08/12/2015 9:38 AM

## 2015-08-12 NOTE — Consult Note (Signed)
Patient with hepatitis C and cirrhosis recently admitted for also mental status and suspected sepsis. Workup included CT of the abdomen which noted a 2 cm lesion in her liver suspicious for hepatocellular carcinoma.  AFP is only mildly elevated. Once patient's acute symptoms resolve can consider biopsy with possible ablation in interventional radiology.  Will further discuss with interventional radiology. Full consult to follow.

## 2015-08-12 NOTE — Progress Notes (Signed)
ANTIBIOTIC CONSULT NOTE -Follow up  Pharmacy Consult for vancomycin and zosyn dosing Indication: sepsis  No Known Allergies  Patient Measurements: Height: 5\' 5"  (165.1 cm) Weight: 193 lb 12.8 oz (87.907 kg) IBW/kg (Calculated) : 57 Adjusted Body Weight: 69.4kg  Vital Signs: Temp: 98.3 F (36.8 C) (10/11 1145) Temp Source: Oral (10/11 1145) BP: 142/69 mmHg (10/11 1145) Pulse Rate: 110 (10/11 1145) Intake/Output from previous day: 10/10 0701 - 10/11 0700 In: 4345.4 [P.O.:1110; I.V.:2985.4; IV Piggyback:250] Out: 1000 [Urine:1000] Intake/Output from this shift: Total I/O In: 120 [P.O.:120] Out: -   Labs:  Recent Labs  08/10/15 2321 08/11/15 0408  WBC 11.2* 11.6*  HGB 12.5 11.8*  PLT 130* 105*  CREATININE 1.81* 1.56*   Estimated Creatinine Clearance: 38.3 mL/min (by C-G formula based on Cr of 1.56).   Microbiology: Recent Results (from the past 720 hour(s))  Culture, blood (routine x 2)     Status: None (Preliminary result)   Collection Time: 08/10/15 11:21 PM  Result Value Ref Range Status   Specimen Description BLOOD RIGHT ANTECUBITAL  Final   Special Requests BOTTLES DRAWN AEROBIC AND ANAEROBIC 6CC  Final   Culture NO GROWTH < 12 HOURS  Final   Report Status PENDING  Incomplete  Culture, blood (routine x 2)     Status: None (Preliminary result)   Collection Time: 08/10/15 11:21 PM  Result Value Ref Range Status   Specimen Description BLOOD BLOOD LEFT FOREARM  Final   Special Requests BOTTLES DRAWN AEROBIC AND ANAEROBIC 6CC  Final   Culture NO GROWTH < 12 HOURS  Final   Report Status PENDING  Incomplete  Urine culture     Status: None   Collection Time: 08/11/15 12:46 AM  Result Value Ref Range Status   Specimen Description URINE, RANDOM  Final   Special Requests Normal  Final   Culture NO GROWTH 1 DAY  Final   Report Status 08/12/2015 FINAL  Final    Medical History: Past Medical History  Diagnosis Date  . Hypertension     Medications:   Anti-infectives    Start     Dose/Rate Route Frequency Ordered Stop   08/11/15 1300  vancomycin (VANCOCIN) IVPB 1000 mg/200 mL premix     1,000 mg 200 mL/hr over 60 Minutes Intravenous Every 24 hours 08/11/15 0145     08/11/15 0900  piperacillin-tazobactam (ZOSYN) IVPB 3.375 g     3.375 g 12.5 mL/hr over 240 Minutes Intravenous 3 times per day 08/11/15 0142     08/11/15 0030  piperacillin-tazobactam (ZOSYN) IVPB 3.375 g     3.375 g 100 mL/hr over 30 Minutes Intravenous  Once 08/11/15 0029 08/11/15 0123   08/11/15 0030  vancomycin (VANCOCIN) IVPB 1000 mg/200 mL premix     1,000 mg 200 mL/hr over 60 Minutes Intravenous  Once 08/11/15 0029 08/11/15 0200     Assessment: Blood and urine cx pending UA: (-) CXR: no active disease  Goal of Therapy:  Vancomycin trough level 15-20 mcg/ml  Plan:  TBW 83.9kg  IBW 57kg  DW 68kg  Vd 48L kei 0.031 hr-1  t1/2 22 hours Vancomycin 1 gram q 24 hours ordered with stacked dosing. Level before 5th dose. Zosyn 3.375 grams q 8 hours ordered.  10/11: Scr improved to 1.56. Ke= 0.031  T1/2 22.36  Vd 48.5. Vancomcyin 1 gram IV Q24h still appropriate per calculations. Trough ordered for 10/13.  Chinita Greenland PharmD Clinical Pharmacist 08/12/2015 2:30 PM

## 2015-08-13 ENCOUNTER — Encounter: Payer: Self-pay | Admitting: Infectious Diseases

## 2015-08-13 ENCOUNTER — Inpatient Hospital Stay
Admit: 2015-08-13 | Discharge: 2015-08-13 | Disposition: A | Discharge status: Home / Self Care | Payer: Medicare Other | Attending: Internal Medicine | Admitting: Internal Medicine

## 2015-08-13 DIAGNOSIS — R531 Weakness: Secondary | ICD-10-CM

## 2015-08-13 DIAGNOSIS — R002 Palpitations: Secondary | ICD-10-CM

## 2015-08-13 DIAGNOSIS — R0602 Shortness of breath: Secondary | ICD-10-CM

## 2015-08-13 DIAGNOSIS — R4182 Altered mental status, unspecified: Secondary | ICD-10-CM

## 2015-08-13 DIAGNOSIS — K769 Liver disease, unspecified: Secondary | ICD-10-CM

## 2015-08-13 DIAGNOSIS — R079 Chest pain, unspecified: Secondary | ICD-10-CM

## 2015-08-13 DIAGNOSIS — R109 Unspecified abdominal pain: Secondary | ICD-10-CM

## 2015-08-13 DIAGNOSIS — R5383 Other fatigue: Secondary | ICD-10-CM

## 2015-08-13 DIAGNOSIS — A419 Sepsis, unspecified organism: Principal | ICD-10-CM

## 2015-08-13 DIAGNOSIS — I10 Essential (primary) hypertension: Secondary | ICD-10-CM

## 2015-08-13 DIAGNOSIS — Z87891 Personal history of nicotine dependence: Secondary | ICD-10-CM

## 2015-08-13 DIAGNOSIS — K7460 Unspecified cirrhosis of liver: Secondary | ICD-10-CM | POA: Diagnosis present

## 2015-08-13 DIAGNOSIS — I471 Supraventricular tachycardia: Secondary | ICD-10-CM

## 2015-08-13 DIAGNOSIS — R7989 Other specified abnormal findings of blood chemistry: Secondary | ICD-10-CM

## 2015-08-13 DIAGNOSIS — B1920 Unspecified viral hepatitis C without hepatic coma: Secondary | ICD-10-CM

## 2015-08-13 LAB — CBC
HCT: 35.3 % (ref 35.0–47.0)
HEMOGLOBIN: 12 g/dL (ref 12.0–16.0)
MCH: 33 pg (ref 26.0–34.0)
MCHC: 34.1 g/dL (ref 32.0–36.0)
MCV: 96.7 fL (ref 80.0–100.0)
Platelets: 92 10*3/uL — ABNORMAL LOW (ref 150–440)
RBC: 3.66 MIL/uL — AB (ref 3.80–5.20)
RDW: 16.1 % — ABNORMAL HIGH (ref 11.5–14.5)
WBC: 9 10*3/uL (ref 3.6–11.0)

## 2015-08-13 LAB — BASIC METABOLIC PANEL
ANION GAP: 4 — AB (ref 5–15)
BUN: 10 mg/dL (ref 6–20)
CHLORIDE: 106 mmol/L (ref 101–111)
CO2: 27 mmol/L (ref 22–32)
Calcium: 7.8 mg/dL — ABNORMAL LOW (ref 8.9–10.3)
Creatinine, Ser: 0.71 mg/dL (ref 0.44–1.00)
Glucose, Bld: 127 mg/dL — ABNORMAL HIGH (ref 65–99)
POTASSIUM: 3.3 mmol/L — AB (ref 3.5–5.1)
SODIUM: 137 mmol/L (ref 135–145)

## 2015-08-13 LAB — AFP TUMOR MARKER: AFP TUMOR MARKER: 8.5 ng/mL — AB (ref 0.0–8.3)

## 2015-08-13 LAB — GLUCOSE, CAPILLARY
GLUCOSE-CAPILLARY: 144 mg/dL — AB (ref 65–99)
GLUCOSE-CAPILLARY: 188 mg/dL — AB (ref 65–99)
GLUCOSE-CAPILLARY: 222 mg/dL — AB (ref 65–99)
GLUCOSE-CAPILLARY: 138 mg/dL — AB (ref 65–99)
Glucose-Capillary: 241 mg/dL — ABNORMAL HIGH (ref 65–99)

## 2015-08-13 MED ORDER — INSULIN STARTER KIT- PEN NEEDLES (ENGLISH)
1.0000 | Freq: Once | Status: AC
  Administered 2015-08-13: 1
  Filled 2015-08-13: qty 1

## 2015-08-13 MED ORDER — DILTIAZEM HCL 100 MG IV SOLR
5.0000 mg/h | INTRAVENOUS | Status: DC
  Filled 2015-08-13: qty 100

## 2015-08-13 MED ORDER — ADENOSINE 12 MG/4ML IV SOLN
12.0000 mg | Freq: Once | INTRAVENOUS | Status: AC
  Administered 2015-08-13: 12 mg via INTRAVENOUS
  Filled 2015-08-13: qty 4

## 2015-08-13 MED ORDER — POTASSIUM CHLORIDE CRYS ER 20 MEQ PO TBCR
20.0000 meq | EXTENDED_RELEASE_TABLET | Freq: Three times a day (TID) | ORAL | Status: DC
  Administered 2015-08-13 – 2015-08-16 (×10): 20 meq via ORAL
  Filled 2015-08-13 (×10): qty 1

## 2015-08-13 MED ORDER — LIVING WELL WITH DIABETES BOOK
Freq: Once | Status: AC
  Administered 2015-08-13: 12:00:00
  Filled 2015-08-13: qty 1

## 2015-08-13 MED ORDER — SOTALOL HCL 80 MG PO TABS
80.0000 mg | ORAL_TABLET | Freq: Two times a day (BID) | ORAL | Status: DC
  Administered 2015-08-13 – 2015-08-16 (×7): 80 mg via ORAL
  Filled 2015-08-13 (×7): qty 1

## 2015-08-13 MED ORDER — ADENOSINE 6 MG/2ML IV SOLN
6.0000 mg | Freq: Once | INTRAVENOUS | Status: AC
  Administered 2015-08-13: 6 mg via INTRAVENOUS
  Filled 2015-08-13: qty 2

## 2015-08-13 MED ORDER — METOPROLOL TARTRATE 1 MG/ML IV SOLN
5.0000 mg | Freq: Once | INTRAVENOUS | Status: AC
  Administered 2015-08-13: 5 mg via INTRAVENOUS
  Filled 2015-08-13: qty 5

## 2015-08-13 MED ORDER — METOPROLOL TARTRATE 25 MG PO TABS
25.0000 mg | ORAL_TABLET | Freq: Once | ORAL | Status: AC
  Administered 2015-08-13: 25 mg via ORAL
  Filled 2015-08-13: qty 1

## 2015-08-13 MED ORDER — METOPROLOL TARTRATE 25 MG PO TABS
25.0000 mg | ORAL_TABLET | Freq: Two times a day (BID) | ORAL | Status: DC
  Administered 2015-08-13 – 2015-08-16 (×6): 25 mg via ORAL
  Filled 2015-08-13 (×6): qty 1

## 2015-08-13 MED ORDER — VANCOMYCIN HCL IN DEXTROSE 1-5 GM/200ML-% IV SOLN
1000.0000 mg | Freq: Two times a day (BID) | INTRAVENOUS | Status: DC
  Administered 2015-08-13: 1000 mg via INTRAVENOUS
  Filled 2015-08-13 (×2): qty 200

## 2015-08-13 MED ORDER — METOPROLOL TARTRATE 25 MG PO TABS: 25.0000 mg | ORAL_TABLET | Freq: Two times a day (BID) | ORAL | Status: DC

## 2015-08-13 NOTE — Progress Notes (Signed)
ANTIBIOTIC CONSULT NOTE -Follow up  Pharmacy Consult for vancomycin and zosyn dosing Indication: sepsis  No Known Allergies  Patient Measurements: Height: 5\' 5"  (165.1 cm) Weight: 193 lb 12.8 oz (87.907 kg) IBW/kg (Calculated) : 57 Adjusted Body Weight: 69.4kg  Vital Signs: BP: 141/85 mmHg (10/12 0506) Pulse Rate: 119 (10/12 0506) Intake/Output from previous day: 10/11 0701 - 10/12 0700 In: 2750 [P.O.:1200; I.V.:1500; IV Piggyback:50] Out: 0  Intake/Output from this shift: Total I/O In: 120 [P.O.:120] Out: -   Labs:  Recent Labs  08/10/15 2321 08/11/15 0408 08/13/15 0452  WBC 11.2* 11.6* 9.0  HGB 12.5 11.8* 12.0  PLT 130* 105* 92*  CREATININE 1.81* 1.56* 0.71   Estimated Creatinine Clearance: 74.8 mL/min (by C-G formula based on Cr of 0.71).   Microbiology: Recent Results (from the past 720 hour(s))  Culture, blood (routine x 2)     Status: None (Preliminary result)   Collection Time: 08/10/15 11:21 PM  Result Value Ref Range Status   Specimen Description BLOOD RIGHT ANTECUBITAL  Final   Special Requests BOTTLES DRAWN AEROBIC AND ANAEROBIC 6CC  Final   Culture NO GROWTH 3 DAYS  Final   Report Status PENDING  Incomplete  Culture, blood (routine x 2)     Status: None (Preliminary result)   Collection Time: 08/10/15 11:21 PM  Result Value Ref Range Status   Specimen Description BLOOD BLOOD LEFT FOREARM  Final   Special Requests BOTTLES DRAWN AEROBIC AND ANAEROBIC 6CC  Final   Culture NO GROWTH 3 DAYS  Final   Report Status PENDING  Incomplete  Urine culture     Status: None   Collection Time: 08/11/15 12:46 AM  Result Value Ref Range Status   Specimen Description URINE, RANDOM  Final   Special Requests Normal  Final   Culture NO GROWTH 1 DAY  Final   Report Status 08/12/2015 FINAL  Final    Medical History: Past Medical History  Diagnosis Date  . Hypertension     Medications:  Anti-infectives    Start     Dose/Rate Route Frequency Ordered Stop   08/13/15 1030  vancomycin (VANCOCIN) IVPB 1000 mg/200 mL premix     1,000 mg 200 mL/hr over 60 Minutes Intravenous Every 12 hours 08/13/15 1012     08/11/15 1300  vancomycin (VANCOCIN) IVPB 1000 mg/200 mL premix  Status:  Discontinued     1,000 mg 200 mL/hr over 60 Minutes Intravenous Every 24 hours 08/11/15 0145 08/13/15 1012   08/11/15 0900  piperacillin-tazobactam (ZOSYN) IVPB 3.375 g     3.375 g 12.5 mL/hr over 240 Minutes Intravenous 3 times per day 08/11/15 0142     08/11/15 0030  piperacillin-tazobactam (ZOSYN) IVPB 3.375 g     3.375 g 100 mL/hr over 30 Minutes Intravenous  Once 08/11/15 0029 08/11/15 0123   08/11/15 0030  vancomycin (VANCOCIN) IVPB 1000 mg/200 mL premix     1,000 mg 200 mL/hr over 60 Minutes Intravenous  Once 08/11/15 0029 08/11/15 0200     Assessment: Pharmacy consulted to dose Zosyn and Vancomycin in a 69 yo female for empiric treatment of sepsis.  Currently ordered Zosyn 3.375 gm IV q8h and Vancomycin 1 gm IV q24h.   SCr: 0.71 (0.8), est CrCl~74.8 mL/min, ke: 0.066, t1/2: 10.5, Vd: 48.6 L  Goal of Therapy:  Vancomycin trough level 15-20 mcg/ml  Plan:  Renal function improved. Will transition patient to Vancomycin 1 gm IV q12h.  Will check trough prior to 4th dose on 10/13 at  2200 (close to steady state).    Continue Zosyn 3.375 gm IV q8h per EI protocol.  Pharmacy will continue to follow.  Murrell Converse, PharmD Clinical Pharmacist 08/13/2015

## 2015-08-13 NOTE — Consult Note (Signed)
Reason for Consult: tachycardia atrial flutter Referring Physician:  Dr Lavetta Nielsen, Dr Barnabas Harries Yvonne Mcdaniel is an 69 y.o. female.  HPI:  Patient was admitted with a history of hypertension hepatitis-C presented with altered mental status and fatigue dose of concerned that she had sepsis and infection patient is treated IV antibiotics blood cultures however speak returned negative she is had significant improvement in her mental status blood pressure patient started having tachycardia something she never had before with heart rates up to 200 narrow complex she was treated with adenosine x2 46 mg and 12 her rate slowed. Down which suggested flow weighs but that she has bad backup with these to 150 cardiology was consulted when I saw the patient is suggested is probably atrial flutter and I gave a dose of metoprolol 5 mg and she ventrally converted to si.nus rhythm at 90. Patient had significant palpitations when the rate was 200 which shortness of breath at that moment but she has been relatively asymptomatic since then. Denied any blackout spells of  Syncope. Patient reportedly saw Dr Saralyn Pilar in the past as part of preoperative assessment for hip surgery for studies were reasonably normal in she did relatively well was about a year ago. Patient now presents with tachycardia unclear etiology.  Past Medical History  Diagnosis Date  . Hypertension   . Cirrhosis of liver without ascites ([place])   . Hep C w/o coma, chronic ([place])     History reviewed. No pertinent past surgical history.  Family History  Problem Relation Age of Onset  . Hypertension Other     Social History:  reports that she has quit smoking. She does not have any smokeless tobacco history on file. She reports that she does not drink alcohol or use illicit drugs.  Allergies: No Known Allergies  Medications: I have reviewed the patient's current medications.  Results for orders placed or performed during the hospital encounter of  08/10/15 (from the past 48 hour(s))  Glucose, capillary     Status: Abnormal   Collection Time: 08/11/15  9:33 PM  Result Value Ref Range   Glucose-Capillary 206 (H) 65 - 99 mg/dL   Comment 1 Notify RN   Troponin I (q 6hr x 3)     Status: Abnormal   Collection Time: 08/12/15 12:41 AM  Result Value Ref Range   Troponin I 0.65 (H) <0.031 ng/mL    Comment: READ BACK AND VERIFIED WITH TIFFANY MADDOX AT 7078 ON 08/12/15.Marland KitchenMarland KitchenNorthern California Advanced Surgery Center LP        POSSIBLE MYOCARDIAL ISCHEMIA. SERIAL TESTING RECOMMENDED.   AFP tumor marker     Status: Abnormal   Collection Time: 08/12/15  5:58 AM  Result Value Ref Range   AFP-Tumor Marker 8.5 (H) 0.0 - 8.3 ng/mL    Comment: (NOTE) Roche ECLIA methodology Performed At: [facility] [place], [place] [id number] Lindon Romp MD EO:[id number]   Troponin I (q 6hr x 3)     Status: Abnormal   Collection Time: 08/12/15  5:58 AM  Result Value Ref Range   Troponin I 0.47 (H) <0.031 ng/mL    Comment: RESULTS PREVIOUSLY CALLED TO  TIFFANY MADDOX AT 0135 ON 08/12/15.Marland KitchenMarland KitchenWilton        PERSISTENTLY INCREASED TROPONIN VALUES IN THE RANGE OF 0.04-0.49 ng/mL CAN BE SEEN IN:       -UNSTABLE ANGINA       -CONGESTIVE HEART FAILURE       -MYOCARDITIS       -CHEST TRAUMA       -  ARRYHTHMIAS       -LATE PRESENTING MYOCARDIAL INFARCTION       -COPD   CLINICAL FOLLOW-UP RECOMMENDED.   Glucose, capillary     Status: Abnormal   Collection Time: 08/12/15  7:38 AM  Result Value Ref Range   Glucose-Capillary 164 (H) 65 - 99 mg/dL  Glucose, capillary     Status: Abnormal   Collection Time: 08/12/15 11:47 AM  Result Value Ref Range   Glucose-Capillary 211 (H) 65 - 99 mg/dL  Troponin I (q 6hr x 3)     Status: Abnormal   Collection Time: 08/12/15 12:30 PM  Result Value Ref Range   Troponin I 0.30 (H) <0.031 ng/mL    Comment: RESULTS PREVIOUSLY CALLED TO TIFFANY MADDOX  @ 7092 ON 08/12/15 BY [facility]        PERSISTENTLY INCREASED TROPONIN VALUES IN THE RANGE  OF 0.04-0.49 ng/mL CAN BE SEEN IN:       -UNSTABLE ANGINA       -CONGESTIVE HEART FAILURE       -MYOCARDITIS       -CHEST TRAUMA       -ARRYHTHMIAS       -LATE PRESENTING MYOCARDIAL INFARCTION       -COPD   CLINICAL FOLLOW-UP RECOMMENDED.   Glucose, capillary     Status: Abnormal   Collection Time: 08/12/15  5:09 PM  Result Value Ref Range   Glucose-Capillary 192 (H) 65 - 99 mg/dL  Glucose, capillary     Status: Abnormal   Collection Time: 08/12/15  9:36 PM  Result Value Ref Range   Glucose-Capillary 205 (H) 65 - 99 mg/dL  CBC     Status: Abnormal   Collection Time: 08/13/15  4:52 AM  Result Value Ref Range   WBC 9.0 3.6 - 11.0 K/uL   RBC 3.66 (L) 3.80 - 5.20 MIL/uL   Hemoglobin 12.0 12.0 - 16.0 g/dL   HCT 35.3 35.0 - 47.0 %   MCV 96.7 80.0 - 100.0 fL   MCH 33.0 26.0 - 34.0 pg   MCHC 34.1 32.0 - 36.0 g/dL   RDW 16.1 (H) 11.5 - 14.5 %   Platelets 92 (L) 150 - 440 K/uL  Basic metabolic panel     Status: Abnormal   Collection Time: 08/13/15  4:52 AM  Result Value Ref Range   Sodium 137 135 - 145 mmol/L   Potassium 3.3 (L) 3.5 - 5.1 mmol/L   Chloride 106 101 - 111 mmol/L   CO2 27 22 - 32 mmol/L   Glucose, Bld 127 (H) 65 - 99 mg/dL   BUN 10 6 - 20 mg/dL   Creatinine, Ser 0.71 0.44 - 1.00 mg/dL   Calcium 7.8 (L) 8.9 - 10.3 mg/dL   GFR calc non Af Amer >60 >60 mL/min   GFR calc Af Amer >60 >60 mL/min    Comment: (NOTE) The eGFR has been calculated using the CKD EPI equation. This calculation has not been validated in all clinical situations. eGFR's persistently <60 mL/min signify possible Chronic Kidney Disease.    Anion gap 4 (L) 5 - 15  Glucose, capillary     Status: Abnormal   Collection Time: 08/13/15  8:16 AM  Result Value Ref Range   Glucose-Capillary 144 (H) 65 - 99 mg/dL  Glucose, capillary     Status: Abnormal   Collection Time: 08/13/15 11:19 AM  Result Value Ref Range   Glucose-Capillary 188 (H) 65 - 99 mg/dL  Glucose, capillary  Status: Abnormal    Collection Time: 08/13/15  3:47 PM  Result Value Ref Range   Glucose-Capillary 222 (H) 65 - 99 mg/dL    No results found.  Review of Systems  Constitutional: Negative.   HENT: Negative.   Eyes: Negative.   Respiratory: Positive for shortness of breath.   Cardiovascular: Positive for chest pain and palpitations.  Gastrointestinal: Negative.   Genitourinary: Negative.   Musculoskeletal: Negative.   Skin: Negative.   Neurological: Negative.   Endo/Heme/Allergies: Negative.   Psychiatric/Behavioral: Negative.    Blood pressure 140/88, pulse 99, temperature 97.6 F (36.4 C), temperature source Oral, resp. rate 18, height '5\' 5"'  (1.651 m), weight 87.907 kg (193 lb 12.8 oz), SpO2 97 %. Physical Exam  Nursing note and vitals reviewed. Constitutional: She is oriented to person, place, and time. She appears well-developed and well-nourished.  HENT:  Head: Normocephalic and atraumatic.  Eyes: Conjunctivae and EOM are normal. Pupils are equal, round, and reactive to light.  Neck: Normal range of motion. Neck supple.  Cardiovascular: Regular rhythm, S1 normal, S2 normal and normal pulses.  Tachycardia present.   Murmur heard.  Systolic murmur is present with a grade of 2/6  Respiratory: Effort normal and breath sounds normal.  GI: Soft. Bowel sounds are normal.  Musculoskeletal: Normal range of motion.  Neurological: She is alert and oriented to person, place, and time. She has normal reflexes.  Skin: Skin is warm and dry.  Psychiatric: She has a normal mood and affect.    Assessment/Plan:  atrial flutter  possible SVT  hypertension  diabetes  obesity  hepatitis-C  cirrhosis  elevated troponin  hip pain  chronic renal insufficiency . PLAN  continue  Telemetry  short-term anticoagulation  agree with sotalol therapy for rhythm control  low-dose beta-blockade therapy for rate control  consider switching quinapril hydralazine amlodipine in favor of metoprolol  inhalers as  necessary for possible COPD continue hypertension control  DVT prophylaxis  consider long-term anticoagulation  agree with antibiotic therapy as necessary  chronic renal insufficiency have the patient follow-up with  nephrology   CALLWOOD,DWAYNE D. 08/13/2015, 5:19 PM

## 2015-08-13 NOTE — Consult Note (Signed)
[place]  [phone] [phone]  ID: LINZI OHLINGER OB: 02/08/1946  MR#: [id number]  JAS#:[id number]  Patient Care Team: [facility] as PCP - General Clent Jacks, RN as Registered Nurse  CHIEF COMPLAINT:  Chief Complaint  Patient presents with  . Nausea  . Emesis    INTERVAL HISTORY: Patient is a 69 year old female with known hepatitis C and cirrhosis who was recently admitted with altered mental status and suspected sepsis.  She is currently being evaluated for supraventricular tachycardia therefore exam was minimal.  During workup for her sepsis a CT of the abdomen was completed and noted to have a suspicious 2 cm mass in her liver consistent with hepatocellular carcinoma. She also has a mildly elevated AFP of 8.5.  REVIEW OF SYSTEMS:   Review of Systems  Constitutional: Positive for malaise/fatigue. Negative for fever.  Respiratory: Positive for shortness of breath.   Cardiovascular: Positive for chest pain and palpitations.  Gastrointestinal: Positive for abdominal pain.  Genitourinary: Negative.   Musculoskeletal: Negative.   Neurological: Positive for weakness.    As per HPI. Otherwise, a complete review of systems is negatve.  PAST MEDICAL HISTORY: Past Medical History  Diagnosis Date  . Hypertension     PAST SURGICAL HISTORY: History reviewed. No pertinent past surgical history.  FAMILY HISTORY Family History  Problem Relation Age of Onset  . Hypertension Other        ADVANCED DIRECTIVES:    HEALTH MAINTENANCE: Social History  Substance Use Topics  . Smoking status: Former Research scientist (life sciences)  . Smokeless tobacco: None  . Alcohol Use: No     Colonoscopy:  PAP:  Bone density:  Lipid panel:  No Known Allergies  Current Facility-Administered Medications  Medication Dose Route Frequency Provider Last Rate Last Dose  . amLODipine (NORVASC) tablet 5 mg  5 mg Oral Daily Lytle Butte, MD   5 mg at 08/13/15 0835   . gabapentin (NEURONTIN) capsule 300 mg  300 mg Oral TID Fritzi Mandes, MD   300 mg at 08/13/15 0835  . hydrALAZINE (APRESOLINE) injection 10 mg  10 mg Intravenous Q6H PRN Fritzi Mandes, MD      . insulin aspart (novoLOG) injection 0-20 Units  0-20 Units Subcutaneous TID WC Fritzi Mandes, MD   4 Units at 08/13/15 1237  . insulin aspart (novoLOG) injection 0-5 Units  0-5 Units Subcutaneous QHS Fritzi Mandes, MD   2 Units at 08/12/15 2207  . insulin glargine (LANTUS) injection 15 Units  15 Units Subcutaneous QHS Henreitta Leber, MD   15 Units at 08/12/15 2207  . metoprolol tartrate (LOPRESSOR) tablet 25 mg  25 mg Oral BID Dwayne D Callwood, MD      . morphine 2 MG/ML injection 2 mg  2 mg Intravenous Q4H PRN Lytle Butte, MD      . ondansetron [facility]) tablet 4 mg  4 mg Oral Q6H PRN Lytle Butte, MD       Or  . ondansetron [facility]) injection 4 mg  4 mg Intravenous Q6H PRN Lytle Butte, MD   4 mg at 08/12/15 4193  . oxyCODONE (Oxy IR/ROXICODONE) immediate release tablet 5 mg  5 mg Oral Q4H PRN Lytle Butte, MD   5 mg at 08/13/15 7902  . piperacillin-tazobactam (ZOSYN) IVPB 3.375 g  3.375 g Intravenous 3 times per day Henreitta Leber, MD   3.375 g at 08/13/15 0830  . potassium chloride SA (K-DUR,KLOR-CON) CR tablet 20 mEq  20  mEq Oral TID Henreitta Leber, MD      . quinapril (ACCUPRIL) tablet 20 mg  20 mg Oral BH-q7a Lytle Butte, MD   20 mg at 08/13/15 0826  . sodium chloride 0.9 % injection 3 mL  3 mL Intravenous Q12H Lytle Butte, MD   3 mL at 08/13/15 0830  . sotalol (BETAPACE) tablet 80 mg  80 mg Oral Q12H Dionisio David, MD   80 mg at 08/13/15 1514    OBJECTIVE: Filed Vitals:   08/13/15 1549  BP: 140/88  Pulse: 99  Temp: 97.6 F (36.4 C)  Resp: 18     Body mass index is 32.25 kg/(m^2).    ECOG FS:2 - Symptomatic, <50% confined to bed  General: Well-developed, well-nourished, no acute distress. Eyes: Pink conjunctiva, anicteric sclera. Abdomen: Soft, nontender, nondistended. No organomegaly  noted, normoactive bowel sounds. Musculoskeletal: No edema, cyanosis, or clubbing. Neuro: Alert, answering all questions appropriately. Cranial nerves grossly intact. Skin: No rashes or petechiae noted. Psych: Normal affect.   LAB RESULTS:  Lab Results  Component Value Date   NA 137 08/13/2015   K 3.3* 08/13/2015   CL 106 08/13/2015   CO2 27 08/13/2015   GLUCOSE 127* 08/13/2015   BUN 10 08/13/2015   CREATININE 0.71 08/13/2015   CALCIUM 7.8* 08/13/2015   PROT 7.1 08/11/2015   ALBUMIN 2.6* 08/11/2015   AST 105* 08/11/2015   ALT 54 08/11/2015   ALKPHOS 195* 08/11/2015   BILITOT 3.6* 08/11/2015   GFRNONAA >60 08/13/2015   GFRAA >60 08/13/2015    Lab Results  Component Value Date   WBC 9.0 08/13/2015   NEUTROABS 9.6* 08/10/2015   HGB 12.0 08/13/2015   HCT 35.3 08/13/2015   MCV 96.7 08/13/2015   PLT 92* 08/13/2015     STUDIES: Ct Abdomen Pelvis Wo Contrast  08/11/2015  CLINICAL DATA:  69 year old female with nausea vomiting confusion. Initial encounter. EXAM: CT ABDOMEN AND PELVIS WITHOUT CONTRAST TECHNIQUE: Multidetector CT imaging of the abdomen and pelvis was performed following the standard protocol without IV contrast. COMPARISON:  CT left hip 06/12/2012. FINDINGS: Negative lung bases. No pericardial or pleural effusion. Mild cardiomegaly. Tortuous descending thoracic aorta with calcified atherosclerosis. Widespread advanced disc and endplate degeneration in the spine. Advanced right hip joint degeneration. Left hip arthroplasty changes with streak artifact. No acute osseous abnormality identified. No pelvic free fluid identified. Negative noncontrast uterus and adnexa. The bladder is diminutive and largely not visible. Gas and stool in the rectum. The sigmoid colon is redundant but largely decompressed. The left colon is decompressed. Transverse colon is negative. Redundant hepatic flexure. The cecum is located in the pelvis. Normal appendix. Negative terminal ileum. No  dilated small bowel. The stomach is distended with fluid. The duodenum is negative. The liver is nodular and cirrhotic and there is a low-density right hepatic lobe lesion measuring about 2 cm on series 2, image 12. The gallbladder is surgically absent. No splenomegaly. There is a small subcapsular low-density splenic lesion on series 2, image 24. Nearby small calcified granuloma in the spleen. Noncontrast pancreas and adrenal glands are within normal limits. No abdominal free fluid or free air. Extensive Aortoiliac calcified atherosclerosis noted. Negative non contrast right kidney and right ureter. Negative non contrast left kidney and left ureter. Mild right inguinal venous varix which tracks into the ventral abdominal wall and an 2 Zinn chair he. Most apparent on coronal images there is a large ventral abdominal VAC this tracks communicates with the liver  hilum along the falciform. See coronal image 21. IMPRESSION: 1. Cirrhotic liver with 2 cm low-density lesion in the right lobe suspicious for hepatocellular carcinoma in this setting. Correlate with AFP level. 2. 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 3. Calcified aortic atherosclerosis. Advanced degenerative changes in the spine and at the right hip. Electronically Signed   By: Genevie Ann M.D.   On: 08/11/2015 00:50   Ct Head Wo Contrast  08/11/2015  CLINICAL DATA:  General sickness, nausea, vomiting, and confusion. EXAM: CT HEAD WITHOUT CONTRAST TECHNIQUE: Contiguous axial images were obtained from the base of the skull through the vertex without intravenous contrast. COMPARISON:  MRI brain 01/29/2014.  CT head 06/12/2012. FINDINGS: Mild cerebral atrophy. Patchy low-attenuation changes in the deep white matter consistent with small vessel ischemia. No significant ventricular dilatation. No mass effect or midline shift. No abnormal extra-axial fluid  collections. Gray-white matter junctions are distinct. Basal cisterns are not effaced. No evidence of acute intracranial hemorrhage. No depressed skull fractures. Visualized paranasal sinuses and mastoid air cells are not opacified. IMPRESSION: No acute intracranial abnormalities. Mild chronic atrophy and small vessel ischemic changes. Electronically Signed   By: Lucienne Capers M.D.   On: 08/11/2015 00:45   Dg Chest Port 1 View  08/10/2015  CLINICAL DATA:  Fever, chills, nausea, vomiting, and confusion. EXAM: PORTABLE CHEST 1 VIEW COMPARISON:  09/03/2012 FINDINGS: Heart size and pulmonary vascularity are normal for technique. No focal airspace disease or consolidation in the lungs. No blunting of costophrenic angles. No pneumothorax. Calcified and tortuous aorta. Mediastinal contours appear intact. Degenerative changes in the shoulders. IMPRESSION: No active disease. Electronically Signed   By: Lucienne Capers M.D.   On: 08/10/2015 23:54    ASSESSMENT: Liver lesion highly suspicious for hepatocellular carcinoma.  PLAN:    1. Liver lesion: Given patient's active hepatitis C as well as elevated AFP, this is highly suspicious for underlying hepatocellular carcinoma. We will discuss patient at cancer conference on Thursday at lunchtime to determine if interventional radiology would be an appropriate intervention. Will order PT and a PTT for completeness. 2. SVT: Treatment per cardiology. 3. Sepsis: Continue current antibiotics. No obvious source has been identified.  Appreciate consult, will follow.   Lloyd Huger, MD   08/13/2015 4:29 PM

## 2015-08-13 NOTE — Progress Notes (Signed)
Patient's heart rate has been in the 1 teens to 130's this morning. Sinus tach to sinus arrhythmia. Patient is asymptomatic. No rate controlled medications are ordered. Dr. Verdell Carmine notified.

## 2015-08-13 NOTE — Progress Notes (Addendum)
Inpatient Diabetes Program Recommendations  AACE/ADA: New Consensus Statement on Inpatient Glycemic Control (2015)  Target Ranges:  Prepandial:   less than 140 mg/dL      Peak postprandial:   less than 180 mg/dL (1-2 hours)      Critically ill patients:  140 - 180 mg/dL  Results for Yvonne Mcdaniel, Yvonne Mcdaniel (MRN [id number]) as of 08/13/2015 10:55  Ref. Range 08/12/2015 07:38 08/12/2015 11:47 08/12/2015 17:09 08/12/2015 21:36 08/13/2015 08:16  Glucose-Capillary Latest Ref Range: 65-99 mg/dL 164 (H) 211 (H) 192 (H) 205 (H) 144 (H)  Results for Yvonne Mcdaniel, Yvonne Mcdaniel (MRN [id number]) as of 08/13/2015 10:55  Ref. Range 08/11/2015 04:08  Hemoglobin A1C Latest Ref Range: 4.0-6.0 % 7.2 (H)   Review of Glycemic Control  Diabetes history: No Outpatient Diabetes medications: NA Current orders for Inpatient glycemic control: Lantus 15 units QHS, Novolog 0-20 units TID with meals, Novolog 0-5 units HS  Inpatient Diabetes Program Recommendations: HgbA1C: A1C 7.2% on 08/11/15 which meets ADA criteria to diagnose patient with DM2. Please inform patient and nursing staff of diagnosis so patient can be educated by bedside RNs on diabetes. Also, please provide information on discharge plan for DM management (PO versus insulin).  08/13/15'@13' :15-Discussed A1C earlier this am with Dr. Verdell Carmine and he is going to diagnose patient with DM but has not yet informed patient. However her RN has already briefly discussed diabetes with her. Patient states that she has already given her self her insulin at noon today with assistance and instruction from [place], [place]. Spoke with patient and her daughter about new diabetes diagnosis. Patient reports that during her pregnancy with her daughter she had gestational diabetes and took insulin. Discussed A1C results (7.2% on 08/11/15) and explained what an A1C is. Began talking about the basic pathophysiology of DM Type 2 when RN came in the room and asked patient how she was feeling as it was noted on  telemetry monitors that patient's heart rate was between 195-200 bpm. Patient stated that she felt short of breath all of a sudden, weak, and dizzy. Informed patient and her daughter that diabetes coordinator would come back tomorrow to further discuss diabetes and insulin.  When appropriate, RNs to provide ongoing basic DM education at bedside with patient and engage patient to actively check blood glucose and administer insulin injections. Have ordered educational booklet and insulin starter kit.  Thanks, Barnie Alderman, RN, MSN, CCRN, CDE Diabetes Coordinator Inpatient Diabetes Program [phone] (Team Pager from [place] to [place]) [phone] ([facility] office) [phone] [facility] office) [phone] [facility] office)

## 2015-08-13 NOTE — Progress Notes (Signed)
RN was notified by tele clerk around 1330 that heart rate was over 200 in SVT. Upon arriving into the patient's room she was alert and talking to the diabetes coordinator. After checking the connections on the heart monitor it was confirmed patient's heart rate was 204. Patient stated she felt very dizzy and her chest was hurting in the middle down the left side of her body. Dr. Verdell Carmine paged, ordered to give 6mg  adenosine. After order was placed heart rate dropped to the 150's. MD arrived to see the patient and confirmed to go ahead and give the adenosine. Four RN's at bedside, including primary RN. Two IV's were started. Adenosine pushed and patient's heart rate went down to 108 for a brief moment then went back up quickly to 150's. Dr. Verdell Carmine paged again, stated to give another 12mg  of adenosine. Dose given and patient's heart rate dropped to 48 at one point and came back up to the 140's. On tele strip it was noted patient was in aflutter once her heart rate slowed down. Dr. Clayborn Bigness consulted and came to see the patient around 25. MD ordered EKG which showed flutter. 5mg  of metoprolol IV ordered, given with cardiologist at bedside. Heart rate stayed in the 140's and MD was going to order a cardizem drip in the unit, orders were placed. At 1439 patient converted to NSR heart rate 95. Dr. Verdell Carmine and [facility] both notified and transfer orders were cancelled. Cardiologist ordered to give 25 PO metoprolol after IV was given to stabilize the patient. PO sotolol was also ordered by prime MD, confirmed with cardiology that both of these were okay to give together, MD confirmed. Blood pressure and vitals are stable. Heart rate is still in the high 90's sinus rhythm. Patient and family have been updated on everything and all questions were answered. Will closely monitor.

## 2015-08-13 NOTE — Progress Notes (Signed)
[facility] NAME: Yvonne Mcdaniel    MR#:  [id number]  DATE OF BIRTH:  Sep 29, 1946  SUBJECTIVE:   Patient here due to suspected sepsis. Afebrile, hemodynamically stable. Blood cultures have been negative. Develop some palpitations and chest pain this afternoon and noted to be in rapid SVT. Daughter at bedside.  REVIEW OF SYSTEMS:   Review of Systems  Constitutional: Negative for fever, chills and weight loss.  HENT: Negative for ear discharge, ear pain and nosebleeds.   Eyes: Negative for blurred vision, pain and discharge.  Respiratory: Negative for sputum production, shortness of breath, wheezing and stridor.   Cardiovascular: Positive for chest pain and palpitations. Negative for orthopnea and PND.  Gastrointestinal: Negative for nausea, vomiting, abdominal pain and diarrhea.  Genitourinary: Negative for urgency and frequency.  Musculoskeletal: Negative for back pain and joint pain.  Neurological: Positive for weakness (generalized). Negative for sensory change, speech change and focal weakness.  Psychiatric/Behavioral: Negative for depression and hallucinations. The patient is not nervous/anxious.   All other systems reviewed and are negative.  Tolerating Diet: Regular  Tolerating PT: Evaluation noted  DRUG ALLERGIES:  No Known Allergies  VITALS:  Blood pressure 137/62, pulse 96, temperature 98.3 F (36.8 C), temperature source Oral, resp. rate 17, height '5\' 5"'  (1.651 m), weight 87.907 kg (193 lb 12.8 oz), SpO2 98 %.  PHYSICAL EXAMINATION:   Physical Exam  GENERAL:  69 y.o.-year-old patient lying in the bed with no acute distress EYES: Pupils equal, round, reactive to light and accommodation. No scleral icterus. Extraocular muscles intact.  HEENT: Head atraumatic, normocephalic. Oropharynx and nasopharynx clear.  NECK:  Supple, no jugular venous distention. No thyroid enlargement, no tenderness.  LUNGS: Normal breath  sounds bilaterally, no wheezing, rales, rhonchi. No use of accessory muscles of respiration.  CARDIOVASCULAR: S1, S2 tachycardic. No murmurs, rubs, or gallops.  ABDOMEN: Soft, nontender, nondistended. Bowel sounds present. No organomegaly or mass.  EXTREMITIES: No cyanosis, clubbing or edema b/l.    NEUROLOGIC: Cranial nerves II through XII are intact. No focal Motor or sensory deficits b/l.  Globally weak PSYCHIATRIC: The patient is alert and oriented x 3. Good affect SKIN: No obvious rash, lesion, or ulcer.    LABORATORY PANEL:   CBC  Recent Labs Lab 08/13/15 0452  WBC 9.0  HGB 12.0  HCT 35.3  PLT 92*    Chemistries   Recent Labs Lab 08/11/15 0408 08/13/15 0452  NA 141 137  K 3.7 3.3*  CL 108 106  CO2 19* 27  GLUCOSE 354* 127*  BUN 22* 10  CREATININE 1.56* 0.71  CALCIUM 8.8* 7.8*  AST 105*  --   ALT 54  --   ALKPHOS 195*  --   BILITOT 3.6*  --     Cardiac Enzymes  Recent Labs Lab 08/12/15 1230  TROPONINI 0.30*    RADIOLOGY:  No results found.   ASSESSMENT AND PLAN:   69 year old Caucasian female history of essential hypertension, hepatitis C presenting with altered mental status  #1.Sepsis-patient met criteria as she presented with tachycardia, leukocytosis and tachypnea and noted to have elevated lactic acid.  -Source still unclear. Currently afebrile, hemodynamically stable. -Blood and sputum cultures have all been negative. I will DC vancomycin continue Zosyn. I will probably discontinue all antibiotics tomorrow. Sepsis is likely been ruled out.  #2. Anion gap metabolic acidosis/lactic acidosis: This is likely D lactic acidosis secondary to Liver disease.  No evidence of hypotension and end-organ  damage or hypoperfusion.   #3 SVT/atrial flutter-patient developed heart rates in the 190s esophageal noon. She was symptomatic with some chest pain and palpitations. She was hemodynamically stable. -Patient was given 2 doses of IV adenosine without  improvement. A cardiology consult was obtained and patient was seen by Dr. Clayborn Bigness. He recommended giving her IV metoprolol which also did not slow her heart rate down. She was then to be transferred to the ICU and started on a Cardizem drip. -Shortly thereafter she broke into normal sinus rhythm. I will start her on low-dose beta blocker for now.  #4. Acute kidney injury: Improved with IV fluids and will monitor  #5. Elevated troponin: Likely in the setting of demand ischemia from tachycardia/SVT  -Patient did have chest pain today but that works with the episode of SVT. I will check a two-dimensional echocardiogram.  #6.Chronic Hepatitis C, abnormal liver function tests and liver lesion suspicious for Hepatocelluar carcinoma -Alpha-fetoprotein slightly elevated. Appreciate gastroenterology/oncology input.  -As per oncology patient to be arranged for biopsy of the liver lesion as an outpatient. -had been evaluated at [facility] and was not recommended and rx for hep C -No evidence of hepatic encephalopathy. Consider lactulose  #7. Acquired Coagulopathy, CT evidence of cirrhosis of liver and large vent abd wall Varices Nothing can be done with this large varix. Family made aware that pt can bleed severely if attempted any abdominal surgery given the severity of the enlarged vessels.  #8.Hyperglycemia/suspect new onset DM-2 -hemoglobin A1c noted to be 7.  -Continue low-dose Lantus, sliding scale 7. Appreciate diabetic lifestyle assessment.  #9 Neuropathy - cont. Neurontin.   Case discussed with Care Management/Social Worker. Management plans discussed with the patient, family and they are in agreement.  CODE STATUS: FULL  DVT Prophylaxis: SCD/TEDS  TOTAL CRITICAL CARE TIME TAKING CARE OF THIS PATIENT: 45 minutes.   POSSIBLE D/C IN 2-3 DAYS, DEPENDING ON CLINICAL CONDITION.    Henreitta Leber M.D on 08/13/2015   Between 7am to 6pm - Pager - [phone]  After 6pm go to [email or web address] -  password [email or web address] [facility]  Office  [phone]  CC: Primary care physician; [facility]

## 2015-08-13 NOTE — Consult Note (Signed)
[facility] Infectious Disease     Reason for Consult:Sepsis Referring Physician: Sainini Date of Admission:  08/10/2015   Active Problems:   Severe sepsis (HCC)   Lactic acidosis   Acute kidney injury ([place])   Elevated troponin   Hepatitis C virus carrier state   Liver lesion   Cirrhosis of liver without ascites ([place])   HPI: Yvonne Mcdaniel is a 69 y.o. female with Hep C cirrhosis admitted with AMS, nv and found to have eleved wbc, elevated HR and RR. Admitted for sepsis and started on vanco and zosyn. CT scan done shows cirrhosis with no ascites but with liver lesion.  Has been afebrile, WBC decreased to nml. Reports feeling back at baseline. Had episodes of SVT today..  Past Medical History  Diagnosis Date  . Hypertension   . Cirrhosis of liver without ascites ([place])   . Hep C w/o coma, chronic ([place])    History reviewed. No pertinent past surgical history. Social History  Substance Use Topics  . Smoking status: Former Research scientist (life sciences)  . Smokeless tobacco: None  . Alcohol Use: No   Family History  Problem Relation Age of Onset  . Hypertension Other     Allergies: No Known Allergies  Current antibiotics: Antibiotics Given (last 72 hours)    Date/Time Action Medication Dose Rate   08/11/15 0821 Given   piperacillin-tazobactam (ZOSYN) IVPB 3.375 g 3.375 g 12.5 mL/hr   08/11/15 1308 Given   vancomycin (VANCOCIN) IVPB 1000 mg/200 mL premix 1,000 mg 200 mL/hr   08/11/15 1642 Given   piperacillin-tazobactam (ZOSYN) IVPB 3.375 g 3.375 g 12.5 mL/hr   08/12/15 0005 Given   piperacillin-tazobactam (ZOSYN) IVPB 3.375 g 3.375 g 12.5 mL/hr   08/12/15 1004 Given   piperacillin-tazobactam (ZOSYN) IVPB 3.375 g 3.375 g 12.5 mL/hr   08/12/15 1301 Given   vancomycin (VANCOCIN) IVPB 1000 mg/200 mL premix 1,000 mg 200 mL/hr   08/12/15 1718 Given   piperacillin-tazobactam (ZOSYN) IVPB 3.375 g 3.375 g 12.5 mL/hr   08/13/15 0100 Given   piperacillin-tazobactam (ZOSYN) IVPB 3.375 g 3.375 g  12.5 mL/hr   08/13/15 0830 Given   piperacillin-tazobactam (ZOSYN) IVPB 3.375 g 3.375 g 12.5 mL/hr   08/13/15 1235 Given   vancomycin (VANCOCIN) IVPB 1000 mg/200 mL premix 1,000 mg 200 mL/hr   08/13/15 1629 Given   piperacillin-tazobactam (ZOSYN) IVPB 3.375 g 3.375 g 12.5 mL/hr      MEDICATIONS: . amLODipine  5 mg Oral Daily  . gabapentin  300 mg Oral TID  . insulin aspart  0-20 Units Subcutaneous TID WC  . insulin aspart  0-5 Units Subcutaneous QHS  . insulin glargine  15 Units Subcutaneous QHS  . metoprolol tartrate  25 mg Oral BID  . piperacillin-tazobactam (ZOSYN)  IV  3.375 g Intravenous 3 times per day  . potassium chloride  20 mEq Oral TID  . quinapril  20 mg Oral [id number]  . sodium chloride  3 mL Intravenous Q12H  . sotalol  80 mg Oral Q12H    Review of Systems - 11 systems reviewed and negative per HPI   OBJECTIVE: Temp:  [97.6 F (36.4 C)-99 F (37.2 C)] 97.6 F (36.4 C) (10/12 1549) Pulse Rate:  [96-198] 99 (10/12 1549) Resp:  [17-25] 18 (10/12 1549) BP: (109-150)/(54-101) 140/88 mmHg (10/12 1549) SpO2:  [88 %-100 %] 97 % (10/12 1549) Physical Exam obese oriented to person, place, and time. appears well-developed and well-nourished. No distress.  HENT: [place]/AT, PERRLA, no scleral icterus Mouth/Throat: Oropharynx  is clear and moist. No oropharyngeal exudate. edenulous Cardiovascular: Normal rate, regular rhythm and normal heart sounds. Exam reveals no gallop and no friction rub.  No murmur heard.  Pulmonary/Chest: Effort normal and breath sounds normal. No respiratory distress.  has no wheezes.  Neck = supple, no nuchal rigidity Abdominal: Soft. Bowel sounds are normal.  exhibits no distension. There is no tenderness.  Lymphadenopathy: no cervical adenopathy. No axillary adenopathy Neurological: alert and oriented to person, place, and time.  Skin: Skin is warm and dry. No rash noted. No erythema.  Psychiatric: a normal mood and affect.  behavior is normal.     LABS: Results for orders placed or performed during the hospital encounter of 08/10/15 (from the past 48 hour(s))  Glucose, capillary     Status: Abnormal   Collection Time: 08/11/15  4:41 PM  Result Value Ref Range   Glucose-Capillary 304 (H) 65 - 99 mg/dL   Comment 1 Notify RN   Glucose, capillary     Status: Abnormal   Collection Time: 08/11/15  9:33 PM  Result Value Ref Range   Glucose-Capillary 206 (H) 65 - 99 mg/dL   Comment 1 Notify RN   Troponin I (q 6hr x 3)     Status: Abnormal   Collection Time: 08/12/15 12:41 AM  Result Value Ref Range   Troponin I 0.65 (H) <0.031 ng/mL    Comment: READ BACK AND VERIFIED WITH TIFFANY MADDOX AT 6606 ON 08/12/15.Marland KitchenMarland KitchenVa Loma Linda [facility]        POSSIBLE MYOCARDIAL ISCHEMIA. SERIAL TESTING RECOMMENDED.   AFP tumor marker     Status: Abnormal   Collection Time: 08/12/15  5:58 AM  Result Value Ref Range   AFP-Tumor Marker 8.5 (H) 0.0 - 8.3 ng/mL    Comment: (NOTE) Roche ECLIA methodology Performed At: [facility] [place], [place] [id number] Lindon Romp MD AT:[id number]   Troponin I (q 6hr x 3)     Status: Abnormal   Collection Time: 08/12/15  5:58 AM  Result Value Ref Range   Troponin I 0.47 (H) <0.031 ng/mL    Comment: RESULTS PREVIOUSLY CALLED TO  TIFFANY MADDOX AT 0135 ON 08/12/15.Marland KitchenMarland KitchenHardwood Acres        PERSISTENTLY INCREASED TROPONIN VALUES IN THE RANGE OF 0.04-0.49 ng/mL CAN BE SEEN IN:       -UNSTABLE ANGINA       -CONGESTIVE HEART FAILURE       -MYOCARDITIS       -CHEST TRAUMA       -ARRYHTHMIAS       -LATE PRESENTING MYOCARDIAL INFARCTION       -COPD   CLINICAL FOLLOW-UP RECOMMENDED.   Glucose, capillary     Status: Abnormal   Collection Time: 08/12/15  7:38 AM  Result Value Ref Range   Glucose-Capillary 164 (H) 65 - 99 mg/dL  Glucose, capillary     Status: Abnormal   Collection Time: 08/12/15 11:47 AM  Result Value Ref Range   Glucose-Capillary 211 (H) 65 - 99 mg/dL  Troponin I (q 6hr x 3)     Status:  Abnormal   Collection Time: 08/12/15 12:30 PM  Result Value Ref Range   Troponin I 0.30 (H) <0.031 ng/mL    Comment: RESULTS PREVIOUSLY CALLED TO TIFFANY MADDOX  @ 0135 ON 08/12/15 BY [facility]        PERSISTENTLY INCREASED TROPONIN VALUES IN THE RANGE OF 0.04-0.49 ng/mL CAN BE SEEN IN:       -UNSTABLE ANGINA       -  CONGESTIVE HEART FAILURE       -MYOCARDITIS       -CHEST TRAUMA       -ARRYHTHMIAS       -LATE PRESENTING MYOCARDIAL INFARCTION       -COPD   CLINICAL FOLLOW-UP RECOMMENDED.   Glucose, capillary     Status: Abnormal   Collection Time: 08/12/15  5:09 PM  Result Value Ref Range   Glucose-Capillary 192 (H) 65 - 99 mg/dL  Glucose, capillary     Status: Abnormal   Collection Time: 08/12/15  9:36 PM  Result Value Ref Range   Glucose-Capillary 205 (H) 65 - 99 mg/dL  CBC     Status: Abnormal   Collection Time: 08/13/15  4:52 AM  Result Value Ref Range   WBC 9.0 3.6 - 11.0 K/uL   RBC 3.66 (L) 3.80 - 5.20 MIL/uL   Hemoglobin 12.0 12.0 - 16.0 g/dL   HCT 35.3 35.0 - 47.0 %   MCV 96.7 80.0 - 100.0 fL   MCH 33.0 26.0 - 34.0 pg   MCHC 34.1 32.0 - 36.0 g/dL   RDW 16.1 (H) 11.5 - 14.5 %   Platelets 92 (L) 150 - 440 K/uL  Basic metabolic panel     Status: Abnormal   Collection Time: 08/13/15  4:52 AM  Result Value Ref Range   Sodium 137 135 - 145 mmol/L   Potassium 3.3 (L) 3.5 - 5.1 mmol/L   Chloride 106 101 - 111 mmol/L   CO2 27 22 - 32 mmol/L   Glucose, Bld 127 (H) 65 - 99 mg/dL   BUN 10 6 - 20 mg/dL   Creatinine, Ser 0.71 0.44 - 1.00 mg/dL   Calcium 7.8 (L) 8.9 - 10.3 mg/dL   GFR calc non Af Amer >60 >60 mL/min   GFR calc Af Amer >60 >60 mL/min    Comment: (NOTE) The eGFR has been calculated using the CKD EPI equation. This calculation has not been validated in all clinical situations. eGFR's persistently <60 mL/min signify possible Chronic Kidney Disease.    Anion gap 4 (L) 5 - 15  Glucose, capillary     Status: Abnormal   Collection Time: 08/13/15  8:16 AM   Result Value Ref Range   Glucose-Capillary 144 (H) 65 - 99 mg/dL  Glucose, capillary     Status: Abnormal   Collection Time: 08/13/15 11:19 AM  Result Value Ref Range   Glucose-Capillary 188 (H) 65 - 99 mg/dL  Glucose, capillary     Status: Abnormal   Collection Time: 08/13/15  3:47 PM  Result Value Ref Range   Glucose-Capillary 222 (H) 65 - 99 mg/dL   No components found for: ESR, C REACTIVE PROTEIN MICRO: Recent Results (from the past 720 hour(s))  Culture, blood (routine x 2)     Status: None (Preliminary result)   Collection Time: 08/10/15 11:21 PM  Result Value Ref Range Status   Specimen Description BLOOD RIGHT ANTECUBITAL  Final   Special Requests BOTTLES DRAWN AEROBIC AND ANAEROBIC 6CC  Final   Culture NO GROWTH 3 DAYS  Final   Report Status PENDING  Incomplete  Culture, blood (routine x 2)     Status: None (Preliminary result)   Collection Time: 08/10/15 11:21 PM  Result Value Ref Range Status   Specimen Description BLOOD BLOOD LEFT FOREARM  Final   Special Requests BOTTLES DRAWN AEROBIC AND ANAEROBIC 6CC  Final   Culture NO GROWTH 3 DAYS  Final   Report Status PENDING  Incomplete  Urine culture     Status: None   Collection Time: 08/11/15 12:46 AM  Result Value Ref Range Status   Specimen Description URINE, RANDOM  Final   Special Requests Normal  Final   Culture NO GROWTH 1 DAY  Final   Report Status 08/12/2015 FINAL  Final    IMAGING: Ct Abdomen Pelvis Wo Contrast  08/11/2015  CLINICAL DATA:  69 year old female with nausea vomiting confusion. Initial encounter. EXAM: CT ABDOMEN AND PELVIS WITHOUT CONTRAST TECHNIQUE: Multidetector CT imaging of the abdomen and pelvis was performed following the standard protocol without IV contrast. COMPARISON:  CT left hip 06/12/2012. FINDINGS: Negative lung bases. No pericardial or pleural effusion. Mild cardiomegaly. Tortuous descending thoracic aorta with calcified atherosclerosis. Widespread advanced disc and endplate  degeneration in the spine. Advanced right hip joint degeneration. Left hip arthroplasty changes with streak artifact. No acute osseous abnormality identified. No pelvic free fluid identified. Negative noncontrast uterus and adnexa. The bladder is diminutive and largely not visible. Gas and stool in the rectum. The sigmoid colon is redundant but largely decompressed. The left colon is decompressed. Transverse colon is negative. Redundant hepatic flexure. The cecum is located in the pelvis. Normal appendix. Negative terminal ileum. No dilated small bowel. The stomach is distended with fluid. The duodenum is negative. The liver is nodular and cirrhotic and there is a low-density right hepatic lobe lesion measuring about 2 cm on series 2, image 12. The gallbladder is surgically absent. No splenomegaly. There is a small subcapsular low-density splenic lesion on series 2, image 24. Nearby small calcified granuloma in the spleen. Noncontrast pancreas and adrenal glands are within normal limits. No abdominal free fluid or free air. Extensive Aortoiliac calcified atherosclerosis noted. Negative non contrast right kidney and right ureter. Negative non contrast left kidney and left ureter. Mild right inguinal venous varix which tracks into the ventral abdominal wall and an 2 Zinn chair he. Most apparent on coronal images there is a large ventral abdominal VAC this tracks communicates with the liver hilum along the falciform. See coronal image 21. IMPRESSION: 1. Cirrhotic liver with 2 cm low-density lesion in the right lobe suspicious for hepatocellular carcinoma in this setting. Correlate with AFP level. 2. 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 3. Calcified aortic atherosclerosis. Advanced degenerative changes in the spine and at the right hip. Electronically Signed   By: Genevie Ann M.D.   On: 08/11/2015 00:50   Ct Head Wo  Contrast  08/11/2015  CLINICAL DATA:  General sickness, nausea, vomiting, and confusion. EXAM: CT HEAD WITHOUT CONTRAST TECHNIQUE: Contiguous axial images were obtained from the base of the skull through the vertex without intravenous contrast. COMPARISON:  MRI brain 01/29/2014.  CT head 06/12/2012. FINDINGS: Mild cerebral atrophy. Patchy low-attenuation changes in the deep white matter consistent with small vessel ischemia. No significant ventricular dilatation. No mass effect or midline shift. No abnormal extra-axial fluid collections. Gray-white matter junctions are distinct. Basal cisterns are not effaced. No evidence of acute intracranial hemorrhage. No depressed skull fractures. Visualized paranasal sinuses and mastoid air cells are not opacified. IMPRESSION: No acute intracranial abnormalities. Mild chronic atrophy and small vessel ischemic changes. Electronically Signed   By: Lucienne Capers M.D.   On: 08/11/2015 00:45   Dg Chest Port 1 View  08/10/2015  CLINICAL DATA:  Fever, chills, nausea, vomiting, and confusion. EXAM: PORTABLE CHEST 1 VIEW COMPARISON:  09/03/2012 FINDINGS: Heart  size and pulmonary vascularity are normal for technique. No focal airspace disease or consolidation in the lungs. No blunting of costophrenic angles. No pneumothorax. Calcified and tortuous aorta. Mediastinal contours appear intact. Degenerative changes in the shoulders. IMPRESSION: No active disease. Electronically Signed   By: Lucienne Capers M.D.   On: 08/10/2015 23:54    Assessment:   Yvonne Mcdaniel is a 69 y.o. female with Hep C cirrhosis admitted with AMS, nv and found to have eleved wbc, elevated HR and RR. Admitted for sepsis and started on vanco and zosyn. CT scan done shows cirrhosis with no ascites but with liver lesion.  Has been afebrile, WBC decreased to nml. Reports feeling back at baseline. Had episodes of SVT today. She had no recent infectious sxs prior to admission. No recent UTI sxs, GI sxs, skin  or soft tissue infections, cough, fevers.  I suspect cardiac issues predominant and do not think active infection involved Recommendations Agree with Dc vanco DC zosyn today Monitor wbc, fever curve off abx. Thank you very much for allowing me to participate in the care of this patient. Please call with questions.   Cheral Marker. Ola Spurr, MD

## 2015-08-14 DIAGNOSIS — D689 Coagulation defect, unspecified: Secondary | ICD-10-CM

## 2015-08-14 LAB — POTASSIUM: POTASSIUM: 3.8 mmol/L (ref 3.5–5.1)

## 2015-08-14 LAB — APTT: APTT: 43 s — AB (ref 24–36)

## 2015-08-14 LAB — PROTIME-INR
INR: 2.03
Prothrombin Time: 23.1 seconds — ABNORMAL HIGH (ref 11.4–15.0)

## 2015-08-14 LAB — GLUCOSE, CAPILLARY
GLUCOSE-CAPILLARY: 153 mg/dL — AB (ref 65–99)
GLUCOSE-CAPILLARY: 150 mg/dL — AB (ref 65–99)
GLUCOSE-CAPILLARY: 136 mg/dL — AB (ref 65–99)
Glucose-Capillary: 157 mg/dL — ABNORMAL HIGH (ref 65–99)
Glucose-Capillary: 140 mg/dL — ABNORMAL HIGH (ref 65–99)

## 2015-08-14 MED ORDER — INSULIN ASPART 100 UNIT/ML ~~LOC~~ SOLN
3.0000 [IU] | Freq: Three times a day (TID) | SUBCUTANEOUS | Status: DC
  Administered 2015-08-14 – 2015-08-16 (×7): 3 [IU] via SUBCUTANEOUS
  Filled 2015-08-14 (×7): qty 3

## 2015-08-14 NOTE — Consult Note (Signed)
[place]  [phone] [phone]  ID: Yvonne Mcdaniel OB: 15-Oct-1946  MR#: [id number]  OZY#:[id number]  Patient Care Team: Perrin Maltese, MD as PCP - General (Internal Medicine) Clent Jacks, RN as Registered Nurse  CHIEF COMPLAINT:  Chief Complaint  Patient presents with  . Nausea  . Emesis    INTERVAL HISTORY: Patient improved from yesterday and is no longer having chest pain or shortness of breath.  She does not complain of abdominal pain. She denies any fevers. She offers no specific complaints today.  REVIEW OF SYSTEMS:   Review of Systems  Constitutional: Positive for malaise/fatigue. Negative for fever.  Cardiovascular: Negative for chest pain and palpitations.  Gastrointestinal: Negative.  Negative for nausea, abdominal pain and diarrhea.  Genitourinary: Negative.   Musculoskeletal: Negative.   Neurological: Negative.     As per HPI. Otherwise, a complete review of systems is negatve.  PAST MEDICAL HISTORY: Past Medical History  Diagnosis Date  . Hypertension   . Cirrhosis of liver without ascites ([place])   . Hep C w/o coma, chronic (HCC)     PAST SURGICAL HISTORY: History reviewed. No pertinent past surgical history.  FAMILY HISTORY Family History  Problem Relation Age of Onset  . Hypertension Other        ADVANCED DIRECTIVES:    HEALTH MAINTENANCE: Social History  Substance Use Topics  . Smoking status: Former Research scientist (life sciences)  . Smokeless tobacco: None  . Alcohol Use: No     Colonoscopy:  PAP:  Bone density:  Lipid panel:  No Known Allergies  Current Facility-Administered Medications  Medication Dose Route Frequency Provider Last Rate Last Dose  . amLODipine (NORVASC) tablet 5 mg  5 mg Oral Daily Lytle Butte, MD   5 mg at 08/14/15 0488  . gabapentin (NEURONTIN) capsule 300 mg  300 mg Oral TID Fritzi Mandes, MD   300 mg at 08/14/15 2154  . hydrALAZINE (APRESOLINE) injection 10 mg  10 mg Intravenous Q6H PRN  Fritzi Mandes, MD      . insulin aspart (novoLOG) injection 0-20 Units  0-20 Units Subcutaneous TID WC Fritzi Mandes, MD   3 Units at 08/14/15 1611  . insulin aspart (novoLOG) injection 0-5 Units  0-5 Units Subcutaneous QHS Fritzi Mandes, MD   2 Units at 08/12/15 2207  . insulin aspart (novoLOG) injection 3 Units  3 Units Subcutaneous TID WC Henreitta Leber, MD   3 Units at 08/14/15 1612  . insulin glargine (LANTUS) injection 15 Units  15 Units Subcutaneous QHS Henreitta Leber, MD   15 Units at 08/14/15 2155  . metoprolol tartrate (LOPRESSOR) tablet 25 mg  25 mg Oral BID Yolonda Kida, MD   25 mg at 08/14/15 2154  . morphine 2 MG/ML injection 2 mg  2 mg Intravenous Q4H PRN Lytle Butte, MD      . ondansetron [facility]) tablet 4 mg  4 mg Oral Q6H PRN Lytle Butte, MD       Or  . ondansetron [facility]) injection 4 mg  4 mg Intravenous Q6H PRN Lytle Butte, MD   4 mg at 08/12/15 0605  . oxyCODONE (Oxy IR/ROXICODONE) immediate release tablet 5 mg  5 mg Oral Q4H PRN Lytle Butte, MD   5 mg at 08/13/15 8916  . potassium chloride SA (K-DUR,KLOR-CON) CR tablet 20 mEq  20 mEq Oral TID Henreitta Leber, MD   20 mEq at 08/14/15 2154  . quinapril (ACCUPRIL) tablet 20  mg  20 mg Oral BH-q7a Lytle Butte, MD   20 mg at 08/14/15 0762  . sodium chloride 0.9 % injection 3 mL  3 mL Intravenous Q12H Lytle Butte, MD   3 mL at 08/14/15 2153  . sotalol (BETAPACE) tablet 80 mg  80 mg Oral Q12H Dionisio David, MD   80 mg at 08/14/15 2153    OBJECTIVE: Filed Vitals:   08/14/15 2003  BP: 140/71  Pulse: 81  Temp: 98 F (36.7 C)  Resp: 16     Body mass index is 32.25 kg/(m^2).    ECOG FS:2 - Symptomatic, <50% confined to bed  General: Well-developed, well-nourished, no acute distress. Eyes: Pink conjunctiva, anicteric sclera. Heart: Regular rate and rhythm  Lungs: Clear to auscultation bilaterally. Abdomen: Soft, nontender, nondistended. No organomegaly noted, normoactive bowel sounds. Musculoskeletal: No edema,  cyanosis, or clubbing. Neuro: Alert, answering all questions appropriately. Cranial nerves grossly intact. Skin: No rashes or petechiae noted. Psych: Normal affect.   LAB RESULTS:  Lab Results  Component Value Date   NA 137 08/13/2015   K 3.8 08/14/2015   CL 106 08/13/2015   CO2 27 08/13/2015   GLUCOSE 127* 08/13/2015   BUN 10 08/13/2015   CREATININE 0.71 08/13/2015   CALCIUM 7.8* 08/13/2015   PROT 7.1 08/11/2015   ALBUMIN 2.6* 08/11/2015   AST 105* 08/11/2015   ALT 54 08/11/2015   ALKPHOS 195* 08/11/2015   BILITOT 3.6* 08/11/2015   GFRNONAA >60 08/13/2015   GFRAA >60 08/13/2015    Lab Results  Component Value Date   WBC 9.0 08/13/2015   NEUTROABS 9.6* 08/10/2015   HGB 12.0 08/13/2015   HCT 35.3 08/13/2015   MCV 96.7 08/13/2015   PLT 92* 08/13/2015     STUDIES: Ct Abdomen Pelvis Wo Contrast  08/11/2015  CLINICAL DATA:  69 year old female with nausea vomiting confusion. Initial encounter. EXAM: CT ABDOMEN AND PELVIS WITHOUT CONTRAST TECHNIQUE: Multidetector CT imaging of the abdomen and pelvis was performed following the standard protocol without IV contrast. COMPARISON:  CT left hip 06/12/2012. FINDINGS: Negative lung bases. No pericardial or pleural effusion. Mild cardiomegaly. Tortuous descending thoracic aorta with calcified atherosclerosis. Widespread advanced disc and endplate degeneration in the spine. Advanced right hip joint degeneration. Left hip arthroplasty changes with streak artifact. No acute osseous abnormality identified. No pelvic free fluid identified. Negative noncontrast uterus and adnexa. The bladder is diminutive and largely not visible. Gas and stool in the rectum. The sigmoid colon is redundant but largely decompressed. The left colon is decompressed. Transverse colon is negative. Redundant hepatic flexure. The cecum is located in the pelvis. Normal appendix. Negative terminal ileum. No dilated small bowel. The stomach is distended with fluid. The  duodenum is negative. The liver is nodular and cirrhotic and there is a low-density right hepatic lobe lesion measuring about 2 cm on series 2, image 12. The gallbladder is surgically absent. No splenomegaly. There is a small subcapsular low-density splenic lesion on series 2, image 24. Nearby small calcified granuloma in the spleen. Noncontrast pancreas and adrenal glands are within normal limits. No abdominal free fluid or free air. Extensive Aortoiliac calcified atherosclerosis noted. Negative non contrast right kidney and right ureter. Negative non contrast left kidney and left ureter. Mild right inguinal venous varix which tracks into the ventral abdominal wall and an 2 Zinn chair he. Most apparent on coronal images there is a large ventral abdominal VAC this tracks communicates with the liver hilum along the falciform. See coronal  image 21. IMPRESSION: 1. Cirrhotic liver with 2 cm low-density lesion in the right lobe suspicious for hepatocellular carcinoma in this setting. Correlate with AFP level. 2. 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 3. Calcified aortic atherosclerosis. Advanced degenerative changes in the spine and at the right hip. Electronically Signed   By: Genevie Ann M.D.   On: 08/11/2015 00:50   Ct Head Wo Contrast  08/11/2015  CLINICAL DATA:  General sickness, nausea, vomiting, and confusion. EXAM: CT HEAD WITHOUT CONTRAST TECHNIQUE: Contiguous axial images were obtained from the base of the skull through the vertex without intravenous contrast. COMPARISON:  MRI brain 01/29/2014.  CT head 06/12/2012. FINDINGS: Mild cerebral atrophy. Patchy low-attenuation changes in the deep white matter consistent with small vessel ischemia. No significant ventricular dilatation. No mass effect or midline shift. No abnormal extra-axial fluid collections. Gray-white matter junctions are distinct. Basal  cisterns are not effaced. No evidence of acute intracranial hemorrhage. No depressed skull fractures. Visualized paranasal sinuses and mastoid air cells are not opacified. IMPRESSION: No acute intracranial abnormalities. Mild chronic atrophy and small vessel ischemic changes. Electronically Signed   By: Lucienne Capers M.D.   On: 08/11/2015 00:45   Dg Chest Port 1 View  08/10/2015  CLINICAL DATA:  Fever, chills, nausea, vomiting, and confusion. EXAM: PORTABLE CHEST 1 VIEW COMPARISON:  09/03/2012 FINDINGS: Heart size and pulmonary vascularity are normal for technique. No focal airspace disease or consolidation in the lungs. No blunting of costophrenic angles. No pneumothorax. Calcified and tortuous aorta. Mediastinal contours appear intact. Degenerative changes in the shoulders. IMPRESSION: No active disease. Electronically Signed   By: Lucienne Capers M.D.   On: 08/10/2015 23:54    ASSESSMENT: Liver lesion highly suspicious for hepatocellular carcinoma.  PLAN:    1. Liver lesion: Given patient's active hepatitis C as well as elevated AFP, this is highly suspicious for underlying hepatocellular carcinoma. Case discussion with radiology with the recommendation of hepatic ultrasound and biopsy if a distinct lesion is seen.  Patient's PT/INR and aPTT are elevated likely secondary to her liver disease.  She will likely need Vitamin K prior to any biopsy.  She can follow up in the [place] in 2 weeks for further evaluation and diagnostic planning. 2. SVT: Treatment per cardiology. 3. Sepsis: Continue current antibiotics. No obvious source has been identified. 4. Coagulopathy:  Secondary to liver disease. Vitamin K as above.   Appreciate consult, all with question.   Lloyd Huger, MD   08/14/2015 11:33 PM

## 2015-08-14 NOTE — Care Management Important Message (Signed)
Important Message  Patient Details  Name: Yvonne Mcdaniel MRN: [id number] Date of Birth: 1946/05/19   Medicare Important Message Given:  Yes-third notification given    Juliann Pulse A Allmond 08/14/2015, 10:40 AM

## 2015-08-14 NOTE — Progress Notes (Signed)
[place] [facility] [place] NAME: Yvonne Mcdaniel    MR#:  [id number]  DATE OF BIRTH:  10-12-1946  SUBJECTIVE:   No further episodes of SVT overnight. He feels better. No complaints. Afebrile, hemodynamically stable.  REVIEW OF SYSTEMS:   Review of Systems  Constitutional: Negative for fever, chills and weight loss.  HENT: Negative for ear discharge, ear pain and nosebleeds.   Eyes: Negative for blurred vision, pain and discharge.  Respiratory: Negative for sputum production, shortness of breath, wheezing and stridor.   Cardiovascular: Negative for chest pain, palpitations, orthopnea and PND.  Gastrointestinal: Negative for nausea, vomiting, abdominal pain and diarrhea.  Genitourinary: Negative for urgency and frequency.  Musculoskeletal: Negative for back pain and joint pain.  Neurological: Positive for weakness (generalized). Negative for sensory change, speech change and focal weakness.  Psychiatric/Behavioral: Negative for depression and hallucinations. The patient is not nervous/anxious.   All other systems reviewed and are negative.  Tolerating Diet: Regular  Tolerating PT: Await Evaluation.   DRUG ALLERGIES:  No Known Allergies  VITALS:  Blood pressure 142/65, pulse 86, temperature 98.5 F (36.9 C), temperature source Oral, resp. rate 18, height '5\' 5"'  (1.651 m), weight 87.907 kg (193 lb 12.8 oz), SpO2 98 %.  PHYSICAL EXAMINATION:   Physical Exam  GENERAL:  69 y.o.-year-old patient lying in the bed with no acute distress EYES: Pupils equal, round, reactive to light and accommodation. No scleral icterus. Extraocular muscles intact.  HEENT: Head atraumatic, normocephalic. Oropharynx and nasopharynx clear.  NECK:  Supple, no jugular venous distention. No thyroid enlargement, no tenderness.  LUNGS: Normal breath sounds bilaterally, no wheezing, rales, rhonchi. No use of accessory muscles of respiration.  CARDIOVASCULAR: S1, S2  tachycardic. No murmurs, rubs, or gallops.  ABDOMEN: Soft, nontender, nondistended. Bowel sounds present. No organomegaly or mass.  EXTREMITIES: No cyanosis, clubbing or edema b/l.    NEUROLOGIC: Cranial nerves II through XII are intact. No focal Motor or sensory deficits b/l.  Globally weak PSYCHIATRIC: The patient is alert and oriented x 3. Good affect SKIN: No obvious rash, lesion, or ulcer.    LABORATORY PANEL:   CBC  Recent Labs Lab 08/13/15 0452  WBC 9.0  HGB 12.0  HCT 35.3  PLT 92*    Chemistries   Recent Labs Lab 08/11/15 0408 08/13/15 0452  NA 141 137  K 3.7 3.3*  CL 108 106  CO2 19* 27  GLUCOSE 354* 127*  BUN 22* 10  CREATININE 1.56* 0.71  CALCIUM 8.8* 7.8*  AST 105*  --   ALT 54  --   ALKPHOS 195*  --   BILITOT 3.6*  --     Cardiac Enzymes  Recent Labs Lab 08/12/15 1230  TROPONINI 0.30*    RADIOLOGY:  No results found.   ASSESSMENT AND PLAN:   69 year old Caucasian female history of essential hypertension, hepatitis C presenting with altered mental status  #1.Sepsis-patient met criteria as she presented with tachycardia, leukocytosis and tachypnea and noted to have elevated lactic acid.  -Sepsis has not been ruled out. Patient's blood cultures and sputum cultures have been negative. -Seen by infectious disease and he agreed with the same antibiotics and following fever curve. Afebrile, hemodynamically stable presently.  #2. Anion gap metabolic acidosis/lactic acidosis: This is likely D lactic acidosis secondary to Liver disease.  No evidence of hypotension and end-organ damage or hypoperfusion.   #3 SVT/atrial flutter-patient developed heart rate in the 150s to 190s yesterday. Given some adenosine  and oral beta blocker and converted to normal sinus rhythm. -Appreciate cardiology input and patient has been started on oral metoprolol/sotalol. rates have been stable. -Await two-dimensional echocardiogram.  #4. Acute kidney injury: Improved  with IV fluids and stable.   #5. Elevated troponin: Likely in the setting of demand ischemia from tachycardia/SVT  -await Echo results.   #6.Chronic Hepatitis C, abnormal liver function tests and liver lesion suspicious for Hepatocelluar carcinoma -Alpha-fetoprotein slightly elevated. Appreciate gastroenterology/oncology input.  -As per oncology patient to be arranged for biopsy of the liver lesion as an outpatient. -had been evaluated at [facility] and was not recommended and rx for hep C -No evidence of hepatic encephalopathy. Consider lactulose  #7. Acquired Coagulopathy, CT evidence of cirrhosis of liver and large vent abd wall Varices Nothing can be done with this large varix. Family made aware that pt can bleed severely if attempted any abdominal surgery given the severity of the enlarged vessels.  #8.Hyperglycemia/suspect new onset DM-2 -hemoglobin A1c noted to be 7.  -continue Lantus, we'll start low-dose NovoLog with meals. Continue sliding scale insulin. -Patient needs to be taught how to give herself insulin.  #9 Neuropathy - cont. Neurontin.   Physical therapy consult to assess patient's mobility.  Case discussed with Care Management/Social Worker. Management plans discussed with the patient, family and they are in agreement.  CODE STATUS: FULL  DVT Prophylaxis: SCD/TEDS  TOTAL TAKING CARE OF THIS PATIENT: 30 minutes.   POSSIBLE D/C IN 1-2 DAYS, DEPENDING ON CLINICAL CONDITION.    Henreitta Leber M.D on 08/14/2015   Between 7am to 6pm - Pager - [phone]  After 6pm go to [email or web address] - password [email or web address] [facility]  Office  [phone]  CC: Primary care physician; [facility]

## 2015-08-14 NOTE — Evaluation (Signed)
Physical Therapy Evaluation Patient Details Name: Yvonne Mcdaniel MRN: [id number] DOB: October 21, 1946 Today's Date: 08/14/2015   History of Present Illness  Pt is a 69 y.o. female presenting to hospital with AMS, nausea, and emesis.  Pt admitted with sepsis and on 08/13/15 pt had episode of palpitations and chest pain (rapid SVT) but now in NSR.  CT scan showing possible hepatocellular carcinoma (oncology consult).  Troponin's downtrending.  PMH includes Hep C and htn.  Clinical Impression  Currently pt demonstrates impairments with general strength, balance, and limitations with functional mobility.  Prior to admission, pt was independent and used SPC as needed for functional mobility.  Pt lives alone in 1 level home with 1 step to enter with B railing (pt's daughter checks on pt daily to assist as needed).  Currently pt is min assist supine to sit (SBA sit to supine), CGA with transfers, and CGA with ambulation 60 feet with RW.  Pt does appear to be deconditioned from baseline but moves fairly well with minimal assist.  Anticipate pt will continue to improve with continued mobility with staff (pt encouraged to ambulate with RW with nursing assist).  Pt would benefit from skilled PT to address above noted impairments and functional limitations.  Recommend pt discharge to home with support of family and with HHPT when medically appropriate.     Follow Up Recommendations Home health PT (assist for stairs)    Equipment Recommendations  Rolling walker with 5" wheels (pt reports she loaned her RW to a friend but can get it back)    Recommendations for Other Services       Precautions / Restrictions Precautions Precautions: Fall Restrictions Weight Bearing Restrictions: No      Mobility  Bed Mobility Overal bed mobility: Needs Assistance Bed Mobility: Supine to Sit;Sit to Supine     Supine to sit: Min assist;HOB elevated (assist for trunk) Sit to supine: Supervision      Transfers Overall  transfer level: Needs assistance Equipment used: Rolling walker (2 wheeled) Transfers: Sit to/from Stand Sit to Stand: Min guard         General transfer comment: steady without loss of balance  Ambulation/Gait Ambulation/Gait assistance: Min guard Ambulation Distance (Feet): 60 Feet Assistive device: Rolling walker (2 wheeled) Gait Pattern/deviations: Step-through pattern Gait velocity: decreased   General Gait Details: decreased B step length/foot clearance/heelstrike but steady without loss of balance  Stairs            Wheelchair Mobility    Modified Rankin (Stroke Patients Only)       Balance Overall balance assessment: Needs assistance Sitting-balance support: No upper extremity supported;Feet supported Sitting balance-Leahy Scale: Good     Standing balance support: Bilateral upper extremity supported (on RW) Standing balance-Leahy Scale: Good                               Pertinent Vitals/Pain Pain Assessment: No/denies pain  Telemetry nurse notified prior to mobility and did not report any concerns prior or during session. See flowsheet for HR and O2 vitals.    Home Living Family/patient expects to be discharged to:: Private residence Living Arrangements: Alone Available Help at Discharge: Family   Home Access: Stairs to enter Entrance Stairs-Rails: Right;Left;Can reach both Technical brewer of Steps: 1 Home Layout: One level [place] - single point (pt had RW but gave it away to a friend (reports she can get it back)) Additional Comments:  Pt's daughter checks on her daily.    Prior Function Level of Independence: Independent with assistive device(s)         Comments: Pt intermittently using SPC as needed.     Hand Dominance        Extremity/Trunk Assessment   Upper Extremity Assessment: Generalized weakness           Lower Extremity Assessment: Generalized weakness         Communication    Communication: No difficulties  Cognition Arousal/Alertness: Awake/alert Behavior During Therapy: WFL for tasks assessed/performed Overall Cognitive Status: Within Functional Limits for tasks assessed                      General Comments   Nursing cleared pt for participation in physical therapy.  Pt agreeable to PT session.    Exercises        Assessment/Plan    PT Assessment Patient needs continued PT services  PT Diagnosis Difficulty walking;Generalized weakness   PT Problem List Decreased strength;Decreased activity tolerance;Decreased balance;Decreased mobility  PT Treatment Interventions DME instruction;Gait training;Stair training;Functional mobility training;Therapeutic activities;Therapeutic exercise;Balance training;Patient/family education   PT Goals (Current goals can be found in the Care Plan section) Acute Rehab PT Goals Patient Stated Goal: to go home PT Goal Formulation: With patient Time For Goal Achievement: 08/28/15 Potential to Achieve Goals: Good    Frequency Min 2X/week   Barriers to discharge        Co-evaluation               End of Session Equipment Utilized During Treatment: Gait belt Activity Tolerance:  (Ambulation distance limited d/t fatigue) Patient left: in bed;with call bell/phone within reach;with bed alarm set;with SCD's reapplied Nurse Communication: Mobility status         Time: 1203-1226 PT Time Calculation (min) (ACUTE ONLY): 23 min   Charges:   PT Evaluation $Initial PT Evaluation Tier I: 1 Procedure     PT G CodesLeitha Bleak 08-17-2015, 1:27 PM Leitha Bleak, [place]

## 2015-08-14 NOTE — Progress Notes (Signed)
Spoke with patient about new diabetes diagnosis. Patient has already been reading the Living Well With Diabetes booklet and reviewed the insulin starter kit. Patient states that she remembers some things about diabetes from when she had gestational diabetes.  Discussed basic pathophysiology of DM Type 2, basic home care, importance of checking CBGs and maintaining good CBG control to prevent long-term and short-term complications. Reviewed signs and symptoms of hyperglycemia and hypoglycemia along with treatment for both. Reviewed both insulin vial&syringe and insulin pens with patient and she states that she prefers to use vial/syringe since she is more familiar with that from when she gave herself insulin shots 28 years ago when she had gestational diabetes. Used teach back to allow patient to re-demonstrate how to draw up insulin from vial to syringe. Patient was able to demonstrate proper technique with some prompting. Asked patient to be sure that her daughter is also educated on how to draw up insulin prior to discharge so that she can assist her if necessary. Talked with patient about Lantus and Novolog and discussed how they are currently ordered. Informed patient that she will be told which insulins and dosages to take at time of discharge. Asked that patient check her glucose 4 times per day (before meals and at bedtime) and to keep a log of glucose readings which she will need to take with her to follow up visits. Talked with Vernie Shanks, RN and asked that patient's daughter be educated on proper technique to draw up insulin from vial to syringe. Encouraged patient to follow up with outpatient diabetes education. RNs to provide ongoing basic DM education at bedside with this patient and engage patient to actively check blood glucose and administer insulin injections.   At time of discharge she will RX for: Glucometer, test strips, lancets, insulin(s), and insulin syringes.  Thanks, Barnie Alderman, RN, MSN,  CCRN, CDE Diabetes Coordinator Inpatient Diabetes Program [phone] (Team Pager from [place] to [place]) [phone] ([facility] office) [phone] [facility] office) [phone] [facility] office)

## 2015-08-14 NOTE — Care Management (Signed)
Patient presents from home.  Lives alone and independent in all adls.  She has chronic liver problems.  Admitted for sepsis.  Within the last 24 hours patient experienced atrial flutter with heart rate over 140.  It is anticipated that patient will discharge home within next 24 hours.  Physical therapy is recommending home health physical therapy and patient is in agreement.  Patient will also benefit from home health nursing for medications management/instruction, cp assess, glycemic assessment and diabetic instruction in relation to new insulin.  Agency choice is [facility].  PCP Dr Lamonte Sakai.  Patient is current with visits.  Confirmed demographcis and contact information.  Patient uses [place] for transportation to MD appointments.  # daughter live locally but work during the day.  Referral faxed to [facility] via  [facility]

## 2015-08-14 NOTE — Plan of Care (Signed)
Problem: Food- and Nutrition-Related Knowledge Deficit (NB-1.1) Goal: Nutrition education Formal process to instruct or train a patient/client in a skill or to impart knowledge to help patients/clients voluntarily manage or modify food choices and eating behavior to maintain or improve health. Outcome: Completed/Met Date Met:  08/14/15    RD consulted for nutrition education regarding new diagnosis of diabetes.     Lab Results  Component Value Date    HGBA1C 7.2* 08/11/2015    RD provided "Carbohydrate Counting for People with Diabetes" handout from the Academy of Nutrition and Dietetics. Discussed different food groups and their effects on blood sugar, emphasizing carbohydrate-containing foods. Provided list of carbohydrates and recommended serving sizes of common foods. RD also provided "The Plate Method" examples for meal planning and carbohydrate counting.  Discussed importance of controlled and consistent carbohydrate intake throughout the day. Provided examples of ways to balance meals/snacks and encouraged intake of high-fiber, whole grain complex carbohydrates. Teach back method used.  Expect good compliance.  Body mass index is 32.25 kg/(m^2).   Current diet order is Heart healthy/Carb modified, patient is consuming approximately 63% of meals on average at this time. Labs and medications reviewed. No further nutrition interventions warranted at this time. RD contact information provided. If additional nutrition issues arise, please re-consult RD.   Dwyane Luo, [place], LDN Pager [phone]

## 2015-08-15 ENCOUNTER — Telehealth: Payer: Self-pay | Admitting: Gastroenterology

## 2015-08-15 LAB — PROTIME-INR
INR: 1.89
PROTHROMBIN TIME: 21.9 s — AB (ref 11.4–15.0)

## 2015-08-15 LAB — CULTURE, BLOOD (ROUTINE X 2)
Culture: NO GROWTH
Culture: NO GROWTH

## 2015-08-15 LAB — GLUCOSE, CAPILLARY
GLUCOSE-CAPILLARY: 222 mg/dL — AB (ref 65–99)
GLUCOSE-CAPILLARY: 146 mg/dL — AB (ref 65–99)
Glucose-Capillary: 102 mg/dL — ABNORMAL HIGH (ref 65–99)
Glucose-Capillary: 107 mg/dL — ABNORMAL HIGH (ref 65–99)
Glucose-Capillary: 190 mg/dL — ABNORMAL HIGH (ref 65–99)

## 2015-08-15 LAB — AMMONIA: AMMONIA: 88 umol/L — AB (ref 9–35)

## 2015-08-15 MED ORDER — METOPROLOL TARTRATE 25 MG PO TABS: 25.0000 mg | ORAL_TABLET | Freq: Two times a day (BID) | ORAL | Status: DC

## 2015-08-15 MED ORDER — LACTULOSE 10 GM/15ML PO SOLN: 30.0000 g | Freq: Three times a day (TID) | ORAL | Status: DC

## 2015-08-15 MED ORDER — SOTALOL HCL 80 MG PO TABS: 80.0000 mg | ORAL_TABLET | Freq: Two times a day (BID) | ORAL | Status: DC

## 2015-08-15 MED ORDER — LACTULOSE 10 GM/15ML PO SOLN
30.0000 g | Freq: Two times a day (BID) | ORAL | Status: DC
  Administered 2015-08-15 – 2015-08-16 (×3): 30 g via ORAL
  Filled 2015-08-15 (×3): qty 60

## 2015-08-15 MED ORDER — INSULIN ASPART 100 UNIT/ML ~~LOC~~ SOLN: 3.0000 [IU] | Freq: Three times a day (TID) | SUBCUTANEOUS | Status: AC

## 2015-08-15 MED ORDER — INSULIN STARTER KIT- SYRINGES (ENGLISH)
1.0000 | Freq: Once | Status: AC
  Administered 2015-08-15: 1
  Filled 2015-08-15: qty 1

## 2015-08-15 MED ORDER — SOTALOL HCL 80 MG PO TABS: 80.0000 mg | ORAL_TABLET | Freq: Two times a day (BID) | ORAL | Status: AC

## 2015-08-15 MED ORDER — LACTULOSE 10 GM/15ML PO SOLN: 30.0000 g | Freq: Two times a day (BID) | ORAL | Status: DC

## 2015-08-15 MED ORDER — INSULIN GLARGINE 100 UNIT/ML ~~LOC~~ SOLN: 15.0000 [IU] | Freq: Every day | SUBCUTANEOUS | Status: AC

## 2015-08-15 NOTE — Discharge Summary (Signed)
[facility] at [place] NAME: Yvonne Mcdaniel    MR#:  [id number]  DATE OF BIRTH:  03-09-1946  DATE OF ADMISSION:  08/10/2015 ADMITTING PHYSICIAN: Lytle Butte, MD  DATE OF DISCHARGE: 08/15/2015  PRIMARY CARE PHYSICIAN: Perrin Maltese, MD    ADMISSION DIAGNOSIS:  Renal insufficiency [N28.9] Elevated troponin [R79.89] Sepsis, due to unspecified organism ([place]) [A41.9] Non-intractable vomiting with nausea, vomiting of unspecified type [R11.2] Acute liver failure without hepatic coma [K72.00]  DISCHARGE DIAGNOSIS:  Active Problems:   Severe sepsis (HCC)   Lactic acidosis   Acute kidney injury ([place])   Elevated troponin   Hepatitis C 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 Hypertension   . Cirrhosis of liver without ascites ([place])   . Hep C w/o coma, chronic [facility])     HOSPITAL COURSE:   69 year old Caucasian female history of essential hypertension, hepatitis C presenting with altered mental status.   #1.Sepsis-patient met criteria on admission as she presented with tachycardia, leukocytosis and tachypnea and noted to have elevated lactic acid.  - now Sepsis has not been ruled out. Patient's blood cultures and sputum cultures have been negative. Now off abx and patient has had no fever in the past 48 hrs.  -Seen by infectious disease and appreciate input and agree with management.  #2. Anion gap metabolic acidosis/lactic acidosis: This was likely D lactic acidosis secondary to Liver disease. No evidence of hypotension and end-organ damage or hypoperfusion.  - now resolved.   #3 SVT/atrial flutter-patient developed heart rate in the 150s to 190s 2 days ago. Given some adenosine and oral beta blocker and converted to normal sinus rhythm. -Appreciate cardiology input and patient has been started on oral metoprolol/sotalol.  rates have been stable since in the past 48 hrs.   -Echo showing normal EF 60-65%.  #4. Acute kidney injury: Improved with IV fluids and stable.   #5. Elevated troponin: Likely in the setting of demand ischemia from tachycardia/SVT.    -Echo showing normal LV function and no wall motion abnormalities.   #6.Chronic Hepatitis C, abnormal liver function tests and liver lesion suspicious for Hepatocelluar carcinoma - Alpha-fetoprotein slightly elevated. Appreciate gastroenterology/oncology input.  -As per oncology patient to be arranged for biopsy of the liver lesion as an outpatient.  -had been evaluated at [facility] and was not recommended and rx for hep C although no record of that in EPIC.   - Now a bit encephalopathic and therefore started on lactulose.  #7. Acquired Coagulopathy, CT evidence of cirrhosis of liver and large vent abd wall Varices Nothing can be done with this large varix. Family made aware that pt can bleed severely if attempted any abdominal surgery given the severity of the enlarged vessels.  #8.Hyperglycemia/suspect new onset DM-2 - hemoglobin A1c noted to be 7.  -She has been started on Lantus and NovoLog with meals and blood sugar stable. We'll likely need to be discharged on insulin.  #9 Neuropathy - cont. Neurontin.   #10 AMS/Encephlopathy - likely due to hepatic encephalopathy - started on lactulose and will monitor. Repeat ammonia in a.m.   DISCHARGE CONDITIONS:   Stable  CONSULTS OBTAINED:  Treatment Team:  Lucilla Lame, MD Adrian Prows, MD Yolonda Kida, MD  DRUG ALLERGIES:  No Known Allergies  DISCHARGE MEDICATIONS:   Current Discharge Medication List  START taking these medications   Details  insulin aspart (NOVOLOG) 100 UNIT/ML injection Inject 3 Units into the skin 3 (three) times daily with meals. Qty: 10 mL, Refills: 11    insulin glargine (LANTUS) 100 UNIT/ML injection Inject 0.15 mLs (15 Units total) into the skin at  bedtime. Qty: 10 mL, Refills: 11    lactulose (CHRONULAC) 10 GM/15ML solution Take 45 mLs (30 g total) by mouth 2 (two) times daily. Qty: 240 mL, Refills: 0    metoprolol tartrate (LOPRESSOR) 25 MG tablet Take 1 tablet (25 mg total) by mouth 2 (two) times daily. Qty: 60 tablet, Refills: 1    sotalol (BETAPACE) 80 MG tablet Take 1 tablet (80 mg total) by mouth 2 (two) times daily. Qty: 60 tablet, Refills: 1      CONTINUE these medications which have NOT CHANGED   Details  amLODipine (NORVASC) 5 MG tablet Take 1 tablet by mouth daily.    quinapril (ACCUPRIL) 20 MG tablet Take 1 tablet by mouth every morning.    tiZANidine (ZANAFLEX) 2 MG tablet Take 2-4 mg by mouth every 8 (eight) hours as needed for muscle spasms.    oxyCODONE (OXY IR/ROXICODONE) 5 MG immediate release tablet Take 1 tablet by mouth 3 (three) times daily as needed.      STOP taking these medications     hydrochlorothiazide (HYDRODIURIL) 25 MG tablet          DISCHARGE INSTRUCTIONS:   DIET:  Cardiac diet  DISCHARGE CONDITION:  Stable  ACTIVITY:  Activity as tolerated  OXYGEN:  Home Oxygen: No.   Oxygen Delivery: room air  DISCHARGE LOCATION:  [facility] if bed available   If you experience worsening of your admission symptoms, develop shortness of breath, life threatening emergency, suicidal or homicidal thoughts you must seek medical attention immediately by calling 911 or calling your MD immediately  if symptoms less severe.  You Must read complete instructions/literature along with all the possible adverse reactions/side effects for all the Medicines you take and that have been prescribed to you. Take any new Medicines after you have completely understood and accpet all the possible adverse reactions/side effects.   Please note  You were cared for by a hospitalist during your hospital stay. If you have any questions about your discharge medications or the care you received while you were in the  hospital after you are discharged, you can call the unit and asked to speak with the hospitalist on call if the hospitalist that took care of you is not available. Once you are discharged, your primary care physician will handle any further medical issues. Please note that NO REFILLS for any discharge medications will be authorized once you are discharged, as it is imperative that you return to your primary care physician (or establish a relationship with a primary care physician if you do not have one) for your aftercare needs so that they can reassess your need for medications and monitor your lab values.    DATA REVIEW:   CBC  Recent Labs Lab 08/13/15 0452  WBC 9.0  HGB 12.0  HCT 35.3  PLT 92*    Chemistries   Recent Labs Lab 08/11/15 0408 08/13/15 0452 08/14/15 1453  NA 141 137  --   K 3.7 3.3* 3.8  CL 108 106  --   CO2 19* 27  --   GLUCOSE 354* 127*  --   BUN 22* 10  --   CREATININE 1.56* 0.71  --   CALCIUM  8.8* 7.8*  --   AST 105*  --   --   ALT 54  --   --   ALKPHOS 195*  --   --   BILITOT 3.6*  --   --     Cardiac Enzymes  Recent Labs Lab 08/12/15 1230  TROPONINI 0.30*    Microbiology Results  Results for orders placed or performed during the hospital encounter of 08/10/15  Culture, blood (routine x 2)     Status: None   Collection Time: 08/10/15 11:21 PM  Result Value Ref Range Status   Specimen Description BLOOD RIGHT ANTECUBITAL  Final   Special Requests BOTTLES DRAWN AEROBIC AND ANAEROBIC 6CC  Final   Culture NO GROWTH 5 DAYS  Final   Report Status 08/15/2015 FINAL  Final  Culture, blood (routine x 2)     Status: None   Collection Time: 08/10/15 11:21 PM  Result Value Ref Range Status   Specimen Description BLOOD BLOOD LEFT FOREARM  Final   Special Requests BOTTLES DRAWN AEROBIC AND ANAEROBIC 6CC  Final   Culture NO GROWTH 5 DAYS  Final   Report Status 08/15/2015 FINAL  Final  Urine culture     Status: None   Collection Time: 08/11/15 12:46  AM  Result Value Ref Range Status   Specimen Description URINE, RANDOM  Final   Special Requests Normal  Final   Culture NO GROWTH 1 DAY  Final   Report Status 08/12/2015 FINAL  Final    RADIOLOGY:  No results found.    Management plans discussed with the patient, family and they are in agreement.  CODE STATUS:     Code Status Orders        Start     Ordered   08/11/15 0112  Full code   Continuous     08/11/15 0112      TOTAL TIME TAKING CARE OF THIS PATIENT: 45 minutes.    Henreitta Leber M.D on 08/15/2015 at 4:21 PM  Between 7am to 6pm - Pager - [phone]  After 6pm go to [email or web address] - password [email or web address] [place] [facility]  Office  [phone]  CC: Primary care physician; Perrin Maltese, MD

## 2015-08-15 NOTE — Telephone Encounter (Signed)
Patients daughter wants to talk to Dr.Wohl regarding  Her mother [phone]

## 2015-08-15 NOTE — Progress Notes (Signed)
Per Dr. Verdell Carmine observe patient administer insulin. While observing the patient she was unable to put steps together to administer insulin without guided assistance. Will practice again after patient eats lunch and daughter returns.

## 2015-08-15 NOTE — Progress Notes (Signed)
Subjective:   states to be doing better denies any palpitations tachycardia no weakness or shortness of breath lying in bed quietly feels reasonably well  Objective:  Vital Signs in the last 24 hours: Temp:  [97.7 F (36.5 C)-98 F (36.7 C)] 97.7 F (36.5 C) (10/14 1251) Pulse Rate:  [68-81] 68 (10/14 1251) Resp:  [16-18] 18 (10/14 1251) BP: (130-146)/(62-71) 130/71 mmHg (10/14 1251) SpO2:  [92 %-99 %] 99 % (10/14 1251)  Intake/Output from previous day: 10/13 0701 - 10/14 0700 In: 360 [P.O.:360] Out: 4600 [Urine:4600] Intake/Output from this shift: Total I/O In: 243 [P.O.:240; I.V.:3] Out: 1050 [Urine:1050]  Physical Exam: General appearance: cooperative and appears stated age Neck: no adenopathy, no carotid bruit, no JVD, supple, symmetrical, trachea midline and thyroid not enlarged, symmetric, no tenderness/mass/nodules Lungs: clear to auscultation bilaterally Heart: regular rate and rhythm, S1, S2 normal, no murmur, click, rub or gallop Abdomen: soft, non-tender; bowel sounds normal; no masses,  no organomegaly Extremities: extremities normal, atraumatic, no cyanosis or edema Pulses: 2+ and symmetric  Lab Results:  Recent Labs  08/13/15 0452  WBC 9.0  HGB 12.0  PLT 92*    Recent Labs  08/13/15 0452 08/14/15 1453  NA 137  --   K 3.3* 3.8  CL 106  --   CO2 27  --   GLUCOSE 127*  --   BUN 10  --   CREATININE 0.71  --    No results for input(s): TROPONINI in the last 72 hours.  Invalid input(s): CK, MB Hepatic Function Panel No results for input(s): PROT, ALBUMIN, AST, ALT, ALKPHOS, BILITOT, BILIDIR, IBILI in the last 72 hours. No results for input(s): CHOL in the last 72 hours. No results for input(s): PROTIME in the last 72 hours.  Imaging: Imaging results have been reviewed  Cardiac Studies:  Assessment/Plan:  Arrhythmia Atrial Fibrillation Chest Pain Palpitations Shortness of Breath   diabetes  hypertension  atrial flutter  hepatitis-C  elevated troponins  renal insufficiency  mild obesity  hypokalemia . PLAN  continue telemetry  continue sotalol 80 mg twice a day for atrial flutter  low-dose metoprolol as long outpatient does not become bradycardic  continue hypertension control with hydralazine metoprolol quinapril  continue diabetes therapy with insulin  recommend weight loss as necessary  lactulose for liver disease as needed  follow-up  Hepatitis-C with GI  elevated troponin probably a demand ischemia  outpatient continue conservative medical therapy  correct electrolytes  LOS: 4 days    Chenika Nevils D. 08/15/2015, 4:11 PM

## 2015-08-15 NOTE — Progress Notes (Signed)
Inpatient Diabetes Program Recommendations  AACE/ADA: New Consensus Statement on Inpatient Glycemic Control (2015)  Target Ranges:  Prepandial:   less than 140 mg/dL      Peak postprandial:   less than 180 mg/dL (1-2 hours)      Critically ill patients:  140 - 180 mg/dL    Spoke with patient and her daughter about diabetes. I  reviewed the insulin starter kit both the pen and the syringe with the daughter. Teach back with daughter on the insulin vial and syringe. Patient states that she remembers some things about diabetes from when she had gestational diabetes. Discussed importance of checking CBGs , documenting blood sugars and reviewed the physiology of both the types of insulin (Lantus and Novolog for discharge) Referenced the Living Well with Diabetes book.  Reviewed signs and symptoms of hyperglycemia and hypoglycemia along with treatment for both.  Encouraged an hs snack and to keep a Juicy Juice juice box at her bedside and when she goes out- showed her the hyper/hypoglycemia info in the book. Talked with patient and daughter about Lantus and Novolog and discussed how they are currently ordered. Asked that patient check her glucose 4 times per day (before meals and at bedtime) and to keep a log of glucose readings which she will need to take with her to follow up visits. I have shown her the log in the Living Well with Diabetes book.   Encouraged patient to follow up with outpatient diabetes education and daughter to attend with her.   I reviewed the meal plan "plate method" of eating with the daughter.  Daughter very supportive and is willing to help her mother.     RNs to provide ongoing basic DM education at bedside with this patient and engage patient to actively check blood glucose and administer insulin injections.   Follow up with Dr. Humphrey Rolls is scheduled for next Friday, August 22, 2015.  Gentry Fitz, RN, BA, MHA, CDE Diabetes Coordinator Inpatient Diabetes Program   [phone] (Team Pager) [phone] ([place]) 08/15/2015 2:32 PM

## 2015-08-15 NOTE — Progress Notes (Signed)
Son at bedside. Requesting that someone from the family be here when patient is transferred or has to sign any papers or make any decisions as patient is still confused (related to ammonia level).

## 2015-08-15 NOTE — Progress Notes (Signed)
Notified Dr. Verdell Carmine of ammonia level of 88 and notified MD about guided assistance with insulin administration. Per MD give first dose of lactulose now.

## 2015-08-15 NOTE — Care Management (Signed)
Spoke with primary nurse Donnisha.  Patient is for discharge today.  Discussed that unable to find documentation that patient has returned demonstration of insulin preparation/ injections or fingerstick blood sugars.  Discussed that patient can not discharge until patient or family member demonstrates these activities.   Spoke with patient's daughter Donella Stade and she says her mother has administered an insulin injections but it not clear if patient is able to pull up the correct dose.  Patient does not have a glucose meter at home.  There is a script for meter, lancets and strips.  [facility] informed of discharge and faxed order/face to face.  Confirmed receipt

## 2015-08-15 NOTE — Care Management (Signed)
Patient's daughter Crystal verbalizes that "patient seems off."  Updated attending and an ammonia level was ordered.  Patient's discharge cancelled due to elevated ammonia level.  Notified [facility]

## 2015-08-15 NOTE — Progress Notes (Signed)
[facility] [facility] at [place] NAME: Yvonne Mcdaniel    MR#:  [id number]  DATE OF BIRTH:  04-Aug-1946  SUBJECTIVE:   No further episodes of SVT overnight.  A bit confused today.  No other complaints.    REVIEW OF SYSTEMS:   Review of Systems  Constitutional: Negative for fever, chills and weight loss.  HENT: Negative for ear discharge, ear pain and nosebleeds.   Eyes: Negative for blurred vision, pain and discharge.  Respiratory: Negative for sputum production, shortness of breath, wheezing and stridor.   Cardiovascular: Negative for chest pain, palpitations, orthopnea and PND.  Gastrointestinal: Negative for nausea, vomiting, abdominal pain and diarrhea.  Genitourinary: Negative for urgency and frequency.  Musculoskeletal: Negative for back pain and joint pain.  Neurological: Positive for weakness (generalized). Negative for sensory change, speech change and focal weakness.  Psychiatric/Behavioral: Negative for depression and hallucinations. The patient is not nervous/anxious.   All other systems reviewed and are negative.  Tolerating Diet: Regular  Tolerating PT: Eval Noted.    DRUG ALLERGIES:  No Known Allergies  VITALS:  Blood pressure 130/71, pulse 68, temperature 97.7 F (36.5 C), temperature source Oral, resp. rate 18, height _0  (1.651 m), weight 87.907 kg (193 lb 12.8 oz), SpO2 99 %.  PHYSICAL EXAMINATION:   Physical Exam  GENERAL:  69 y.o.-year-old patient lying in the bed with no acute distress EYES: Pupils equal, round, reactive to light and accommodation. No scleral icterus. Extraocular muscles intact.  HEENT: Head atraumatic, normocephalic. Oropharynx and nasopharynx clear.  NECK:  Supple, no jugular venous distention. No thyroid enlargement, no tenderness.  LUNGS: Normal breath sounds bilaterally, no wheezing, rales, rhonchi. No use of accessory muscles of respiration.  CARDIOVASCULAR: S1, S2 tachycardic. No murmurs, rubs,  or gallops.  ABDOMEN: Soft, nontender, nondistended. Bowel sounds present. No organomegaly or mass.  EXTREMITIES: No cyanosis, clubbing or edema b/l.    NEUROLOGIC: Cranial nerves II through XII are intact. No focal Motor or sensory deficits b/l.  Globally weak. Mild Tremor.  PSYCHIATRIC: The patient is alert and oriented x 2. Good affect SKIN: No obvious rash, lesion, or ulcer.    LABORATORY PANEL:   CBC  Recent Labs Lab 08/13/15 0452  WBC 9.0  HGB 12.0  HCT 35.3  PLT 92*    Chemistries   Recent Labs Lab 08/11/15 0408 08/13/15 0452 08/14/15 1453  NA 141 137  --   K 3.7 3.3* 3.8  CL 108 106  --   CO2 19* 27  --   GLUCOSE 354* 127*  --   BUN 22* 10  --   CREATININE 1.56* 0.71  --   CALCIUM 8.8* 7.8*  --   AST 105*  --   --   ALT 54  --   --   ALKPHOS 195*  --   --   BILITOT 3.6*  --   --     Cardiac Enzymes  Recent Labs Lab 08/12/15 1230  TROPONINI 0.30*    RADIOLOGY:  No results found.   ASSESSMENT AND PLAN:   69 year old Caucasian female history of essential hypertension, hepatitis C presenting with altered mental status.   #1.Sepsis-patient met criteria as she presented with tachycardia, leukocytosis and tachypnea and noted to have elevated lactic acid.  - now Sepsis has not been ruled out. Patient's blood cultures and sputum cultures have been negative.  Now off abx and has had no fever.   -Seen by infectious disease and appreciate  input.  #2. Anion gap metabolic acidosis/lactic acidosis: This is likely D lactic acidosis secondary to Liver disease.  No evidence of hypotension and end-organ damage or hypoperfusion.   #3 SVT/atrial flutter-patient developed heart rate in the 150s to 190s 2 days ago. Given some adenosine and oral beta blocker and converted to normal sinus rhythm. -Appreciate cardiology input and patient has been started on oral metoprolol/sotalol. rates have been stable. -Echo showing normal EF 60-65%.  #4. Acute kidney injury:  Improved with IV fluids and stable.   #5. Elevated troponin: Likely in the setting of demand ischemia from tachycardia/SVT  -Echo showing normal LV function and no wall motion abnormalities.   #6.Chronic Hepatitis C, abnormal liver function tests and liver lesion suspicious for Hepatocelluar carcinoma -Alpha-fetoprotein slightly elevated. Appreciate gastroenterology/oncology input.  -As per oncology patient to be arranged for biopsy of the liver lesion as an outpatient. -had been evaluated at [facility] and was not recommended and rx for hep C - a bit confused today and ammonia level obtained and noted to be 88 up from the 40's on admission. Started on Lactulose and will monitor.   #7. Acquired Coagulopathy, CT evidence of cirrhosis of liver and large vent abd wall Varices Nothing can be done with this large varix. Family made aware that pt can bleed severely if attempted any abdominal surgery given the severity of the enlarged vessels.  #8.Hyperglycemia/suspect new onset DM-2 - hemoglobin A1c noted to be 7.  -continue Lantus, low-dose NovoLog with meals. Continue sliding scale insulin. - BS stable.    #9 Neuropathy - cont. Neurontin.   #10 AMS/Encephlopathy - likely due to hepatic encephalopathy - started on lactulose and will monitor. Repeat ammonia in a.m.   Patient's daughter and was not happy  and wanted a second opinion regarding her mother's care and therefore requested a transfer to [facility]. I have called the transfer center and set up a transfer and they notified me that they have a long waiting list. The daughter has been notified and she still wants to proceed with the possible transfer to [facility].  Case discussed with Care Management/Social Worker. Management plans discussed with the patient, family and they are in agreement.  CODE STATUS: FULL  DVT Prophylaxis: SCD/TEDS  TOTAL TAKING CARE OF THIS PATIENT: 50 minutes.   POSSIBLE D/C IN 1-2 DAYS, DEPENDING ON CLINICAL CONDITION.    Greater than 50% of time spent in coordination of care and discussion with the daughter, care manager, and also the transfer center at [facility] M.D on 08/15/2015   Between 7am to 6pm - Pager - [phone]  After 6pm go to [email or web address] - password [email or web address] [place] [facility]  Office  [phone]  CC: Primary care physician; Perrin Maltese, MD

## 2015-08-15 NOTE — Care Management Important Message (Signed)
Important Message  Patient Details  Name: Yvonne Mcdaniel MRN: [id number] Date of Birth: August 24, 1946   Medicare Important Message Given:  Yes-fourth notification given    Katrina Stack, RN 08/15/2015, 6:24 PM

## 2015-08-15 NOTE — Progress Notes (Signed)
Spoke with daughter, Byrd Hesselbach [phone]), on the phone. She and her brother have discussed her mother's situation and would like her to be transferred to [facility]. I will notify Dr. Verdell Carmine of this request.

## 2015-08-15 NOTE — Progress Notes (Signed)
Daughter at bedside, updated on plan of care. Patient to start receiving lactulose to improve ammonia level. Will also reinforce diabetes/insulin education while patient remains at hospital. Daughter voiced concerns of process of resolving confusion and ammonia level - educated on ammonia and lactulose. Had to leave for work, but stated she may come back tonight or at least call for an updated. States she is relieved that discharge was cancelled and we are starting to get some answers about the confusion. Education materials printed and placed at bedside.

## 2015-08-15 NOTE — Discharge Instructions (Addendum)
°  DIET:  Cardiac diet  DISCHARGE CONDITION:  Stable  ACTIVITY:  Activity as tolerated  OXYGEN:  Home Oxygen: No.   Oxygen Delivery: room air  DISCHARGE LOCATION:  Home with Home health nursing/PT.     If you experience worsening of your admission symptoms, develop shortness of breath, life threatening emergency, suicidal or homicidal thoughts you must seek medical attention immediately by calling 911 or calling your MD immediately  if symptoms less severe.  You Must read complete instructions/literature along with all the possible adverse reactions/side effects for all the Medicines you take and that have been prescribed to you. Take any new Medicines after you have completely understood and accpet all the possible adverse reactions/side effects.   Please note  You were cared for by a hospitalist during your hospital stay. If you have any questions about your discharge medications or the care you received while you were in the hospital after you are discharged, you can call the unit and asked to speak with the hospitalist on call if the hospitalist that took care of you is not available. Once you are discharged, your primary care physician will handle any further medical issues. Please note that NO REFILLS for any discharge medications will be authorized once you are discharged, as it is imperative that you return to your primary care physician (or establish a relationship with a primary care physician if you do not have one) for your aftercare needs so that they can reassess your need for medications and monitor your lab values.    [facility].  AGENCY WILL CONTACT AND SCHEDULE INITIAL VISIT WITHIN [place] [facility] [place].  [phone]

## 2015-08-15 NOTE — Progress Notes (Signed)
CBG 222 per glucometer

## 2015-08-15 NOTE — Telephone Encounter (Signed)
Pt's daughter stated you saw Yvonne Mcdaniel in the hospital during your last call and you told them if they had any questions they could call you. Sharyn Lull did tell them they made need to come in but she would first like a call from you.

## 2015-08-15 NOTE — Progress Notes (Signed)
Dr. Verdell Carmine on floor - notified of request to transfer to [facility]. Nann, Care Management present during discussion. MD to communicate with daughter.

## 2015-08-15 NOTE — Care Management (Signed)
Patient's daughter Donella Stade is requesting transfer to [facility]. Discussed that it is not medically necessary for patient to transfer so the ems needed for transport would not be covered.   Provided her with estimate of cost if patient traveled by [employer]- 500 dollars.

## 2015-08-16 DIAGNOSIS — G609 Hereditary and idiopathic neuropathy, unspecified: Secondary | ICD-10-CM

## 2015-08-16 DIAGNOSIS — I4892 Unspecified atrial flutter: Secondary | ICD-10-CM

## 2015-08-16 DIAGNOSIS — I471 Supraventricular tachycardia, unspecified: Secondary | ICD-10-CM

## 2015-08-16 DIAGNOSIS — K7290 Hepatic failure, unspecified without coma: Secondary | ICD-10-CM

## 2015-08-16 DIAGNOSIS — E119 Type 2 diabetes mellitus without complications: Secondary | ICD-10-CM

## 2015-08-16 DIAGNOSIS — D696 Thrombocytopenia, unspecified: Secondary | ICD-10-CM

## 2015-08-16 DIAGNOSIS — D689 Coagulation defect, unspecified: Secondary | ICD-10-CM

## 2015-08-16 DIAGNOSIS — K7682 Hepatic encephalopathy: Secondary | ICD-10-CM

## 2015-08-16 LAB — AMMONIA: AMMONIA: 44 umol/L — AB (ref 9–35)

## 2015-08-16 LAB — GLUCOSE, CAPILLARY
GLUCOSE-CAPILLARY: 98 mg/dL (ref 65–99)
Glucose-Capillary: 185 mg/dL — ABNORMAL HIGH (ref 65–99)
Glucose-Capillary: 138 mg/dL — ABNORMAL HIGH (ref 65–99)

## 2015-08-16 MED ORDER — LACTULOSE 10 GM/15ML PO SOLN: 30.0000 g | Freq: Two times a day (BID) | ORAL | Status: AC

## 2015-08-16 MED ORDER — LACTULOSE 10 GM/15ML PO SOLN: 30.0000 g | Freq: Two times a day (BID) | ORAL | Status: DC

## 2015-08-16 MED ORDER — RIFAXIMIN 550 MG PO TABS: 550.0000 mg | ORAL_TABLET | Freq: Two times a day (BID) | ORAL | Status: DC

## 2015-08-16 NOTE — Care Management Note (Signed)
Case Management Note  Patient Details  Name: Yvonne Mcdaniel MRN: [id number] Date of Birth: 08/15/1946  Subjective/Objective:    Referral called to [facility] and faxed to [facility] for RN and PT.                 Action/Plan:   Expected Discharge Date:                  Expected Discharge Plan:     In-House Referral:     Discharge planning Services     Post Acute Care Choice:    Choice offered to:     DME Arranged:    DME Agency:     HH Arranged:    [place] Agency:     Status of Service:     Medicare Important Message Given:  Yes-fourth notification given Date Medicare IM Given:    Medicare IM give by:    Date Additional Medicare IM Given:    Additional Medicare Important Message give by:     If discussed at [place] of Stay Meetings, dates discussed:    Additional Comments:  Molli Gethers A, RN 08/16/2015, 1:32 PM

## 2015-08-16 NOTE — Discharge Summary (Signed)
[facility] [place] NAME: Yvonne Mcdaniel    MR#:  [id number]  DATE OF BIRTH:  1946/03/30  DATE OF ADMISSION:  08/10/2015 ADMITTING PHYSICIAN: Lytle Butte, MD  DATE OF DISCHARGE: No discharge date for patient encounter.  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 Hypertension   . Cirrhosis of liver without ascites ([place])   . Hep C w/o coma, chronic ([place])     .pro HOSPITAL COURSE:   Patient is 69 year old Caucasian female with past medical history significant for history of hepatitis C, essential hypertension, who presents to the hospital with altered mental status, nausea, vomiting, tachycardia. She was noted to have elevated lactic acid level and white blood cell count on admission to emergency room, so code sepsis was initiated and patient was admitted on broad-spectrum antibiotic therapy after blood cultures were taken. Chest x-ray showed no pneumonia . Abdominal CT revealed cirrhotic liver with 2 cm low density in the right lobe, suspicious for hepatocellular carcinoma . AFP level was checked, was found to be slightly elevated at 9.0 . Patient was managed conservatively.  However, since her cultures were negative, antibiotics were stopped. Consultation from infectious disease specialist, Dr. Ola Spurr was obtained who recommended not to continue antibiotic therapy due to lack of any signs of infection. After stopping the antibiotic, patient remained afebrile. While in the hospital, patient developed an episode of SVT. She was given adenosine and had atrial flutter rhythm seen on telemetry . Patient's cardiac enzymes were checked, and that they were found to be minimally elevated . Patient was seen by cardiologist recommended sotalol, metoprolol  , with which patient remained in sinus rhythm . No anticoagulation was recommended due to liver cirrhosis and abnormal pro time level and thrombocytopenia.  She was seen by physical therapist who recommended home health physical therapy. She was about to be discharged home with home health, however, developed confusion and ammonia level was found to be elevated significant hepatic encephalopathy. Patient was initiated on lactulose with improvement of her mental status.  Discussion by problem #1.Sepsis-patient met criteria as she presented with tachycardia, leukocytosis and tachypnea and noted to have elevated lactic acid.  - now Sepsis has not been ruled out. Patient's blood cultures and sputum cultures have been negative. Now off abx and has had no fever.  -Seen by infectious disease and recommended to abstain from antibiotic therapy.  #2. Anion gap metabolic acidosis/lactic acidosis: This is likely D lactic acidosis secondary to Liver disease. No evidence of hypotension and end-organ damage or hypoperfusion.   #3 SVT/atrial flutter-patient developed heart rate in the 150s to 190s 3 days ago. Given adenosine and initiated on oral beta blocker and converted to normal sinus rhythm. Remains in  sinus rhythm now -Appreciate cardiology input and patient has been started on oral metoprolol/sotalol. rates have been stable. -Echo  showing normal EF 60-65%. Patient is to follow up with cardiologist as outpatient  #4. Acute kidney injury: Improved with IV fluids and stable.   #5. Elevated troponin: Likely in the setting of demand ischemia from tachycardia/SVT  -Echo showing normal LV function and no wall motion abnormalities. Cardiology follow-up as outpatient  #6.Chronic Hepatitis C, abnormal liver function tests and liver cirrhosis on CT scan with liver lesion suspicious for Hepatocelluar carcinoma -Alpha-fetoprotein slightly elevated. Appreciate gastroenterology/oncology input.  -As per oncology patient to be arranged for biopsy of the liver lesion as an outpatient. -had been evaluated at [facility] and was not recommended and rx for hep C - Patient was a bit confused yesterday and ammonia level was noted to be 88 up initiated on Lactulose and improved. Patient is to continue lactulose and will be added Xifaxan. Discussed this patient's daughter extensively.   #7. Acquired Coagulopathy, CT evidence of cirrhosis of liver and large vent abd wall Varices Nothing can be done with this large varix. Family made aware that pt can bleed severely if attempted any abdominal surgery given the severity of the enlarged vessels. Now on beta blockers to decrease splanchnic perfusion and the risk of variceal bleeding  #8.New onset DM-2 - hemoglobin A1c noted to be 7.  -continue Lantus, low-dose NovoLog with meals. Continue sliding scale insulin. - BS stable.   #9 Neuropathy - cont. Neurontin.   #10Hepatic encephalopathy - Continue on lactulose and Xifaxan , improved. Patient is to be followed by Dr. Grayland Ormond as outpatient  DISCHARGE CONDITIONS:   Stable  CONSULTS OBTAINED:  Treatment Team:  Lucilla Lame, MD Adrian Prows, MD Yolonda Kida, MD  DRUG ALLERGIES:  No Known Allergies  DISCHARGE MEDICATIONS:   Current Discharge Medication List    START taking these medications   Details  insulin aspart (NOVOLOG) 100  UNIT/ML injection Inject 3 Units into the skin 3 (three) times daily with meals. Qty: 10 mL, Refills: 11    insulin glargine (LANTUS) 100 UNIT/ML injection Inject 0.15 mLs (15 Units total) into the skin at bedtime. Qty: 10 mL, Refills: 11    lactulose (CHRONULAC) 10 GM/15ML solution Take 45 mLs (30 g total) by mouth 2 (two) times daily. Qty: 240 mL, Refills: 0    metoprolol tartrate (LOPRESSOR) 25 MG tablet Take 1 tablet (25 mg total) by mouth 2 (two) times daily. Qty: 60 tablet, Refills: 1    rifaximin (XIFAXAN) 550 MG TABS tablet Take 1 tablet (550 mg total) by mouth 2 (two) times daily. Qty: 60 tablet, Refills: 5    sotalol (BETAPACE) 80 MG tablet Take 1 tablet (80 mg total) by mouth 2 (two) times daily. Qty: 60 tablet, Refills: 1      CONTINUE these medications which have NOT CHANGED   Details  amLODipine (NORVASC) 5 MG tablet Take 1 tablet by mouth daily.    quinapril (ACCUPRIL) 20 MG tablet Take 1 tablet by mouth every morning.    tiZANidine (ZANAFLEX) 2 MG tablet Take 2-4 mg by mouth every 8 (eight) hours as needed for muscle spasms.    oxyCODONE (OXY IR/ROXICODONE) 5 MG immediate release tablet Take 1 tablet by mouth 3 (three) times daily as needed.      STOP taking these medications     hydrochlorothiazide (HYDRODIURIL) 25 MG tablet          DISCHARGE INSTRUCTIONS:    Patient  is to follow-up with primary care physician at [place],  Dr. Grayland Ormond. Dr. Saralyn Pilar within 1 week after discharge  If you experience worsening of your admission symptoms, develop shortness of breath, life threatening emergency, suicidal or homicidal thoughts you must seek medical attention immediately by calling 911 or calling your MD immediately  if symptoms less severe.  You Must read complete instructions/literature along with all the possible adverse reactions/side effects for all the Medicines you take and that have been prescribed to you. Take any new Medicines after you have  completely understood and accept all the possible adverse reactions/side effects.   Please note  You were cared for by a hospitalist during your hospital stay. If you have any questions about your discharge medications or the care you received while you were in the hospital after you are discharged, you can call the unit and asked to speak with the hospitalist on call if the hospitalist that took care of you is not available. Once you are discharged, your primary care physician will handle any further medical issues. Please note that NO REFILLS for any discharge medications will be authorized once you are discharged, as it is imperative that you return to your primary care physician (or establish a relationship with a primary care physician if you do not have one) for your aftercare needs so that they can reassess your need for medications and monitor your lab values.    Today   CHIEF COMPLAINT:   Chief Complaint  Patient presents with  . Nausea  . Emesis    HISTORY OF PRESENT ILLNESS:  Yvonne Mcdaniel  is a 69 y.o. female with a known history of hepatitis C, essential hypertension, who presents to the hospital with altered mental status, nausea, vomiting, tachycardia. She was noted to have elevated lactic acid level and white blood cell count on admission to emergency room, so code sepsis was initiated and patient was admitted on broad-spectrum antibiotic therapy after blood cultures were taken. Chest x-ray showed no pneumonia . Abdominal CT revealed cirrhotic liver with 2 cm low density in the right lobe, suspicious for hepatocellular carcinoma . AFP level was checked, was found to be slightly elevated at 9.0 . Patient was managed conservatively. However, since her cultures were negative, antibiotics were stopped. Consultation from infectious disease specialist, Dr. Ola Spurr was obtained who recommended not to continue antibiotic therapy due to lack of any signs of infection. After stopping the  antibiotic, patient remained afebrile. While in the hospital, patient developed an episode of SVT. She was given adenosine and had atrial flutter rhythm seen on telemetry . Patient's cardiac enzymes were checked, and that they were found to be minimally elevated . Patient was seen by cardiologist recommended sotalol, metoprolol  , with which patient remained in sinus rhythm . No anticoagulation was recommended due to liver cirrhosis and abnormal pro time level and thrombocytopenia.  She was seen by physical therapist who recommended home health physical therapy. She was about to be discharged home with home health, however, developed confusion and ammonia level was found to be elevated significant hepatic encephalopathy. Patient was initiated on lactulose with improvement of her mental status.  Discussion by problem #1.Sepsis-patient met criteria as she presented with tachycardia, leukocytosis and tachypnea and noted to have elevated lactic acid.  - now Sepsis has not been ruled out. Patient's blood cultures and sputum cultures have been negative. Now off abx and has had no fever.  -Seen by infectious disease and recommended to abstain from  antibiotic therapy.  #2. Anion gap metabolic acidosis/lactic acidosis: This is likely D lactic acidosis secondary to Liver disease. No evidence of hypotension and end-organ damage or hypoperfusion.   #3 SVT/atrial flutter-patient developed heart rate in the 150s to 190s 3 days ago. Given adenosine and initiated on oral beta blocker and converted to normal sinus rhythm. Remains in sinus rhythm now -Appreciate cardiology input and patient has been started on oral metoprolol/sotalol. rates have been stable. -Echo showing normal EF 60-65%. Patient is to follow up with cardiologist as outpatient  #4. Acute kidney injury: Improved with IV fluids and stable.   #5. Elevated troponin: Likely in the setting of demand ischemia from tachycardia/SVT  -Echo showing normal  LV function and no wall motion abnormalities. Cardiology follow-up as outpatient  #6.Chronic Hepatitis C, abnormal liver function tests and liver cirrhosis on CT scan with liver lesion suspicious for Hepatocelluar carcinoma -Alpha-fetoprotein slightly elevated. Appreciate gastroenterology/oncology input.  -As per oncology patient to be arranged for biopsy of the liver lesion as an outpatient. -had been evaluated at [facility] and was not recommended and rx for hep C - Patient was a bit confused yesterday and ammonia level was noted to be 88 up initiated on Lactulose and improved. Patient is to continue lactulose and will be added Xifaxan. Discussed this patient's daughter extensively.   #7. Acquired Coagulopathy, CT evidence of cirrhosis of liver and large vent abd wall Varices Nothing can be done with this large varix. Family made aware that pt can bleed severely if attempted any abdominal surgery given the severity of the enlarged vessels. Now on beta blockers to decrease splanchnic perfusion and the risk of variceal bleeding  #8.New onset DM-2 - hemoglobin A1c noted to be 7.  -continue Lantus, low-dose NovoLog with meals. Continue sliding scale insulin. - BS stable.   #9 Neuropathy - cont. Neurontin.   #10Hepatic encephalopathy - Continue on lactulose and Xifaxan , improved. Patient is to be followed by Dr. Grayland Ormond as outpatient    VITAL SIGNS:  Blood pressure 154/77, pulse 77, temperature 98.1 F (36.7 C), temperature source Oral, resp. rate 22, height 5' 5" (1.651 m), weight 87.907 kg (193 lb 12.8 oz), SpO2 98 %.  I/O:   Intake/Output Summary (Last 24 hours) at 08/16/15 1403 Last data filed at 08/16/15 1303  Gross per 24 hour  Intake    360 ml  Output   1200 ml  Net   -840 ml    PHYSICAL EXAMINATION:  GENERAL:  69 y.o.-year-old patient lying in the bed with no acute distress.  EYES: Pupils equal, round, reactive to light and accommodation. No scleral icterus. Extraocular  muscles intact.  HEENT: Head atraumatic, normocephalic. Oropharynx and nasopharynx clear.  NECK:  Supple, no jugular venous distention. No thyroid enlargement, no tenderness.  LUNGS: Normal breath sounds bilaterally, no wheezing, rales,rhonchi or crepitation. No use of accessory muscles of respiration.  CARDIOVASCULAR: S1, S2 normal. No murmurs, rubs, or gallops.  ABDOMEN: Soft, non-tender, non-distended. Bowel sounds present. No organomegaly or mass.  EXTREMITIES: No pedal edema, cyanosis, or clubbing.  NEUROLOGIC: Cranial nerves II through XII are intact. Muscle strength 5/5 in all extremities. Sensation intact. Gait not checked.  PSYCHIATRIC: The patient is alert and oriented x 3.  SKIN: No obvious rash, lesion, or ulcer.   DATA REVIEW:   CBC  Recent Labs Lab 08/13/15 0452  WBC 9.0  HGB 12.0  HCT 35.3  PLT 92*    Chemistries   Recent Labs Lab 08/11/15 0408 08/13/15  8676 08/14/15 1453  NA 141 137  --   K 3.7 3.3* 3.8  CL 108 106  --   CO2 19* 27  --   GLUCOSE 354* 127*  --   BUN 22* 10  --   CREATININE 1.56* 0.71  --   CALCIUM 8.8* 7.8*  --   AST 105*  --   --   ALT 54  --   --   ALKPHOS 195*  --   --   BILITOT 3.6*  --   --     Cardiac Enzymes  Recent Labs Lab 08/12/15 1230  TROPONINI 0.30*    Microbiology Results  Results for orders placed or performed during the hospital encounter of 08/10/15  Culture, blood (routine x 2)     Status: None   Collection Time: 08/10/15 11:21 PM  Result Value Ref Range Status   Specimen Description BLOOD RIGHT ANTECUBITAL  Final   Special Requests BOTTLES DRAWN AEROBIC AND ANAEROBIC 6CC  Final   Culture NO GROWTH 5 DAYS  Final   Report Status 08/15/2015 FINAL  Final  Culture, blood (routine x 2)     Status: None   Collection Time: 08/10/15 11:21 PM  Result Value Ref Range Status   Specimen Description BLOOD BLOOD LEFT FOREARM  Final   Special Requests BOTTLES DRAWN AEROBIC AND ANAEROBIC 6CC  Final   Culture NO  GROWTH 5 DAYS  Final   Report Status 08/15/2015 FINAL  Final  Urine culture     Status: None   Collection Time: 08/11/15 12:46 AM  Result Value Ref Range Status   Specimen Description URINE, RANDOM  Final   Special Requests Normal  Final   Culture NO GROWTH 1 DAY  Final   Report Status 08/12/2015 FINAL  Final    RADIOLOGY:  No results found.  EKG:   Orders placed or performed during the hospital encounter of 08/10/15  . EKG 12-Lead  . EKG 12-Lead  . EKG 12-Lead  . EKG 12-Lead  . EKG 12-Lead  . EKG 12-Lead      Management plans discussed with the patient, family and they are in agreement.  CODE STATUS:     Code Status Orders        Start     Ordered   08/11/15 0112  Full code   Continuous     08/11/15 0112      TOTAL TIME TAKING CARE OF THIS PATIENT: 40 minutes.    Theodoro Grist M.D on 08/16/2015 at 2:03 PM  Between 7am to 6pm - Pager - [phone]  After 6pm go to [email or web address] - password [email or web address]  Office  [phone]  CC: Primary care physician; Perrin Maltese, MD

## 2015-08-16 NOTE — Progress Notes (Signed)
Pt. Discharged to home with [facility] RN & PT via [facility]. Discharge instructions and medication regimen reviewed at bedside with patient and daughter. Both verbalize understanding of instructions and medication regimen. Pt and daughter able to verbalize when patient needs to check her blood sugar and her blood pressure. Extensive time spent explaining each medication and when to take it and what its for. RN clarified with Dr. Clayton Bibles about xifaxan and lactulose instructions, per daughter request. Pt able to verbalize she will take xifaxan 2x a day as ordered and lactulose 2x a day as ordered. If patient is irritated too much by lactulose, dr v told patient and daughter she may take once a day; reinforced with patient that she should have at least 1 BM per day, per Dr. Clayton Bibles specification. Prescriptions sent with patient and patient and daughter aware that two will be available for pick up at the pharmacy. Patient assessment unchanged from this morning. TELE and IV discontinued per policy.

## 2015-08-16 NOTE — Progress Notes (Signed)
Patients daughter, Crystal, called for update from MD. Dr. Ether Griffins rounding and provided with Crystals call back number. MD verbalizes she will update daughter.

## 2015-08-16 NOTE — Progress Notes (Signed)
[facility] [place] NAME: Yvonne Mcdaniel    MR#:  [id number]  DATE OF BIRTH:  1945/11/30  SUBJECTIVE:   No further episodes of SVT overnight.  Confusion, resolved with lactulose. Ammonia level has improved with lactulose. Patient denies any discomfort, would like to go home with home health. Discussed this patient's daughter Chrystal, who is agreeable to pick patient up for discharge to home.   REVIEW OF SYSTEMS:   Review of Systems  Constitutional: Negative for fever, chills and weight loss.  HENT: Negative for ear discharge, ear pain and nosebleeds.   Eyes: Negative for blurred vision, pain and discharge.  Respiratory: Negative for sputum production, shortness of breath, wheezing and stridor.   Cardiovascular: Negative for chest pain, palpitations, orthopnea and PND.  Gastrointestinal: Negative for nausea, vomiting, abdominal pain and diarrhea.  Genitourinary: Negative for urgency and frequency.  Musculoskeletal: Negative for back pain and joint pain.  Neurological: Positive for weakness (generalized). Negative for sensory change, speech change and focal weakness.  Psychiatric/Behavioral: Negative for depression and hallucinations. The patient is not nervous/anxious.   All other systems reviewed and are negative.  Tolerating Diet: Regular  Tolerating PT: Eval Noted.    DRUG ALLERGIES:  No Known Allergies  VITALS:  Blood pressure 154/77, pulse 77, temperature 98.1 F (36.7 C), temperature source Oral, resp. rate 22, height '5\' 5"'  (1.651 m), weight 87.907 kg (193 lb 12.8 oz), SpO2 98 %.  PHYSICAL EXAMINATION:   Physical Exam  GENERAL:  69 y.o.-year-old patient lying in the bed with no acute distress EYES: Pupils equal, round, reactive to light and accommodation. No scleral icterus. Extraocular muscles intact.  HEENT: Head atraumatic, normocephalic. Oropharynx and nasopharynx clear.  NECK:  Supple, no jugular venous distention. No  thyroid enlargement, no tenderness.  LUNGS: Normal breath sounds bilaterally, no wheezing, rales, rhonchi. No use of accessory muscles of respiration.  CARDIOVASCULAR: S1, S2 tachycardic. No murmurs, rubs, or gallops.  ABDOMEN: Soft, nontender, nondistended. Bowel sounds present. No organomegaly or mass.  EXTREMITIES: No cyanosis, clubbing or edema b/l.    NEUROLOGIC: Cranial nerves II through XII are intact. No focal Motor or sensory deficits b/l.  Globally weak. Mild Tremor.  PSYCHIATRIC: The patient is alert and oriented x 2. Good affect SKIN: No obvious rash, lesion, or ulcer.    LABORATORY PANEL:   CBC  Recent Labs Lab 08/13/15 0452  WBC 9.0  HGB 12.0  HCT 35.3  PLT 92*    Chemistries   Recent Labs Lab 08/11/15 0408 08/13/15 0452 08/14/15 1453  NA 141 137  --   K 3.7 3.3* 3.8  CL 108 106  --   CO2 19* 27  --   GLUCOSE 354* 127*  --   BUN 22* 10  --   CREATININE 1.56* 0.71  --   CALCIUM 8.8* 7.8*  --   AST 105*  --   --   ALT 54  --   --   ALKPHOS 195*  --   --   BILITOT 3.6*  --   --     Cardiac Enzymes  Recent Labs Lab 08/12/15 1230  TROPONINI 0.30*    RADIOLOGY:  No results found.   ASSESSMENT AND PLAN:   69 year old Caucasian female history of essential hypertension, hepatitis C presenting with altered mental status.   #1.Sepsis-patient met criteria as she presented with tachycardia, leukocytosis and tachypnea and noted to have elevated lactic acid.  - now Sepsis has not  been ruled out. Patient's blood cultures and sputum cultures have been negative.  Now off abx and has had no fever.   -Seen by infectious disease and appreciate input.  #2. Anion gap metabolic acidosis/lactic acidosis: This is likely D lactic acidosis secondary to Liver disease.  No evidence of hypotension and end-organ damage or hypoperfusion.   #3 SVT/atrial flutter-patient developed heart rate in the 150s to 190s 3 days ago. Given adenosine and initiated on oral beta  blocker and converted to normal sinus rhythm. Remains in sinus rhythm now -Appreciate cardiology input and patient has been started on oral metoprolol/sotalol. rates have been stable. -Echo showing normal EF 60-65%.  #4. Acute kidney injury: Improved with IV fluids and stable.   #5. Elevated troponin: Likely in the setting of demand ischemia from tachycardia/SVT  -Echo showing normal LV function and no wall motion abnormalities.   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 Appreciate gastroenterology/oncology input.  -As per oncology patient to be arranged for biopsy of the liver lesion as an outpatient. -had been evaluated at [facility] and was not recommended and rx for hep C - a bit confused yesterday and ammonia level was noted to be 88 up initiated on Lactulose and improved. Patient is to continue lactulose and will be added Xifaxan. Discussed this patient's daughter.   #7. Acquired Coagulopathy, CT evidence of cirrhosis of liver and large vent abd wall Varices Nothing can be done with this large varix. Family made aware that pt can bleed severely if attempted any abdominal surgery given the severity of the enlarged vessels. Now on beta blockers to decrease splanchnic perfusion and the risk of variceal bleeding  #8.Hyperglycemia/suspect new onset DM-2 - hemoglobin A1c noted to be 7.  -continue Lantus, low-dose NovoLog with meals. Continue sliding scale insulin. - BS stable.    #9 Neuropathy - cont. Neurontin.   #10Hepatic encephalopathy - Continue on lactulose and Xifaxan , improved. Patient is to be followed by Dr. Grayland Ormond as outpatient   Discussed with patient and her daughter about possible discharge home with home health. All questions were answered, she voiced understanding. Coordination of care time is 25 minutes Case discussed with Care  Management/Social Worker. Management plans discussed with the patient, family and they are in agreement.  CODE STATUS: FULL  DVT Prophylaxis: SCD/TEDS  TOTAL TAKING CARE OF THIS PATIENT: 50 minutes.   POSSIBLE D/C IN 1-2 DAYS, DEPENDING ON CLINICAL CONDITION.      Theodoro Grist M.D on 08/16/2015   Between 7am to 6pm - Pager - [phone]  After 6pm go to [email or web address] - password [email or web address] [facility]  Office  [phone]  CC: Primary care physician; Perrin Maltese, MD

## 2015-08-16 NOTE — Progress Notes (Signed)
Patients daughter called up to the floor. Reports patients pharmacy faxed back xifaxan prescription because insurance did not cover. Reports pharmacy needs a diagnosis code, testing frequency and quantity for glucometer, lancets, testing strips and insulin syringes. Prime Doc paged and made aware. Dr. Verdell Carmine asked for pharmacy number which RN provided and will call them to get the prescription sorted out. Dr. Verdell Carmine said Dr. Clayton Bibles will have to take care of the xifaxan prescription tomorrow since he did not prescribe it. This RN and Dr. Clayton Bibles are working tomorrow and this RN will follow up in the morning. Patients daughter updated and aware of all of the above.   Update: 6:36 PM Dr. Verdell Carmine to fax prescription to pharmacy in the next 15 minutes. Daughter, Crystal called and updated.

## 2015-08-16 NOTE — Progress Notes (Signed)
Confused and forgetful. Impulsive. Still receiving lactulose.  No complaints of pain. Up with one assist to [facility]. Had two large BMs. On waiting list to go to [place].

## 2015-08-17 LAB — AFP TUMOR MARKER: AFP-Tumor Marker: 7.5 ng/mL (ref 0.0–8.3)

## 2015-08-17 NOTE — Progress Notes (Signed)
This RN spoke with Dr. Clayton Bibles on the floor today and asked about xifaxan prescription. Per Dr. Ether Griffins, pt will be okay with just lactulose; pt can f/u with primary family doctor to see if they will approve xifaxan. Daughter, Crystal called and RN left a voice message and call back number with the above information.

## 2015-08-19 NOTE — Telephone Encounter (Signed)
Spoke to patients daughter and they have been told to follow up with the oncologist at [facility] and if the mass is not cancer then treatment for her HCV would be recommeneded.

## 2015-08-22 NOTE — Telephone Encounter (Signed)
Called pt's daughter back regarding her appt with [facility] . Advised pt Dr. Allen Norris wants her to go to [facility] to confirm the liver lesion seen on CT scan is not cancer. Once they diagnose this we will determine the next step. Pt has scheduled an appt with Dr. Allen Norris at the end of Nov. We will decide after the [facility] appt next step.

## 2015-08-22 NOTE — Telephone Encounter (Signed)
Pt's daughter called back.

## 2015-08-26 ENCOUNTER — Ambulatory Visit: Payer: Medicare Other | Admitting: Gastroenterology

## 2015-08-28 ENCOUNTER — Other Ambulatory Visit: Payer: Medicare Other

## 2015-08-28 ENCOUNTER — Ambulatory Visit: Payer: Medicare Other | Admitting: Oncology

## 2015-09-05 ENCOUNTER — Other Ambulatory Visit: Payer: Self-pay | Admitting: Internal Medicine

## 2015-09-05 ENCOUNTER — Ambulatory Visit: Payer: Medicare Other | Admitting: *Deleted

## 2015-09-08 ENCOUNTER — Other Ambulatory Visit: Payer: Self-pay | Admitting: Internal Medicine

## 2015-09-08 DIAGNOSIS — Z1231 Encounter for screening mammogram for malignant neoplasm of breast: Secondary | ICD-10-CM

## 2015-09-23 ENCOUNTER — Ambulatory Visit: Payer: Medicare Other

## 2015-10-01 ENCOUNTER — Encounter (INDEPENDENT_AMBULATORY_CARE_PROVIDER_SITE_OTHER): Payer: Self-pay

## 2015-10-01 ENCOUNTER — Encounter: Payer: Self-pay | Admitting: Gastroenterology

## 2015-10-01 ENCOUNTER — Ambulatory Visit (INDEPENDENT_AMBULATORY_CARE_PROVIDER_SITE_OTHER): Payer: Medicare Other | Admitting: Gastroenterology

## 2015-10-01 ENCOUNTER — Other Ambulatory Visit: Payer: Self-pay

## 2015-10-01 VITALS — BP 79/47 | HR 52 | Temp 97.6°F | Ht 61.0 in | Wt 198.0 lb

## 2015-10-01 DIAGNOSIS — K7460 Unspecified cirrhosis of liver: Secondary | ICD-10-CM | POA: Diagnosis not present

## 2015-10-01 DIAGNOSIS — B1920 Unspecified viral hepatitis C without hepatic coma: Secondary | ICD-10-CM | POA: Insufficient documentation

## 2015-10-01 DIAGNOSIS — M1990 Unspecified osteoarthritis, unspecified site: Secondary | ICD-10-CM | POA: Insufficient documentation

## 2015-10-01 DIAGNOSIS — B182 Chronic viral hepatitis C: Secondary | ICD-10-CM | POA: Diagnosis not present

## 2015-10-01 DIAGNOSIS — F419 Anxiety disorder, unspecified: Secondary | ICD-10-CM | POA: Insufficient documentation

## 2015-10-01 DIAGNOSIS — I10 Essential (primary) hypertension: Secondary | ICD-10-CM | POA: Insufficient documentation

## 2015-10-01 NOTE — Progress Notes (Signed)
Primary Care Physician: Perrin Maltese, MD  Primary Gastroenterologist:  Dr. Lucilla Lame  Chief Complaint  Patient presents with  . Hepatitis C    HPI: Yvonne Mcdaniel is a 69 y.o. female here for follow-up after being found to have cirrhosis and hepatitis C. The patient was in the hospital and found to have a liver lesion suspicious for hepatic failure carcinoma. The patient and went to [facility] for evaluation and was found to have a second lesion on MRI. The patient comes today with questions about possible treatment for her hepatitis C. No complaint the present time and is supposed to be set up for embolization at [facility].  Current Outpatient Prescriptions  Medication Sig Dispense Refill  . amLODipine (NORVASC) 5 MG tablet Take 1 tablet by mouth daily.    . B-D UF III MINI PEN NEEDLES 31G X 5 MM MISC 3 (three) times daily.  4  . BAYER CONTOUR NEXT TEST test strip USE ONE TEST STRIP THREE TIMES A DAY  6  . BAYER MICROLET LANCETS lancets THREE TIMES A DAY  6  . Blood Glucose Monitoring Suppl (CONTOUR NEXT EZ MONITOR) W/DEVICE KIT See admin instructions.  0  . furosemide (LASIX) 20 MG tablet TAKE 1 TABLET (20 MG TOTAL) BY MOUTH EVERY MORNING.  11  . gabapentin (NEURONTIN) 300 MG capsule Take 300 mg by mouth 3 (three) times daily.    . insulin aspart (NOVOLOG) 100 UNIT/ML injection Inject 3 Units into the skin 3 (three) times daily with meals. 10 mL 11  . insulin glargine (LANTUS) 100 UNIT/ML injection Inject 0.15 mLs (15 Units total) into the skin at bedtime. 10 mL 11  . lactulose (CHRONULAC) 10 GM/15ML solution Take 45 mLs (30 g total) by mouth 2 (two) times daily. 240 mL 0  . metoprolol tartrate (LOPRESSOR) 25 MG tablet Take 1 tablet (25 mg total) by mouth 2 (two) times daily. 60 tablet 1  . oxyCODONE (OXY IR/ROXICODONE) 5 MG immediate release tablet Take 1 tablet by mouth 3 (three) times daily as needed.    . quinapril (ACCUPRIL) 20 MG tablet Take 1 tablet by mouth every morning.    . sotalol  (BETAPACE) 80 MG tablet Take 1 tablet (80 mg total) by mouth 2 (two) times daily. 60 tablet 1  . spironolactone (ALDACTONE) 50 MG tablet TAKE 1 TABLET (50 MG TOTAL) BY MOUTH DAILY.  11  . tiZANidine (ZANAFLEX) 2 MG tablet Take 2-4 mg by mouth every 8 (eight) hours as needed for muscle spasms.    . cephALEXin (KEFLEX) 250 MG capsule Take 250 mg by mouth 2 (two) times daily.  0  . hydrochlorothiazide (HYDRODIURIL) 25 MG tablet TK 1 T PO QD  3  . hydrOXYzine (ATARAX/VISTARIL) 10 MG tablet Take 10 mg by mouth 3 (three) times daily.  0  . rifaximin (XIFAXAN) 550 MG TABS tablet Take 1 tablet (550 mg total) by mouth 2 (two) times daily. (Patient not taking: Reported on 10/01/2015) 60 tablet 5  . spironolactone (ALDACTONE) 25 MG tablet Take 25 mg by mouth daily.  6   No current facility-administered medications for this visit.    Allergies as of 10/01/2015  . (No Known Allergies)    ROS:  General: Negative for anorexia, weight loss, fever, chills, fatigue, weakness. ENT: Negative for hoarseness, difficulty swallowing , nasal congestion. CV: Negative for chest pain, angina, palpitations, dyspnea on exertion, peripheral edema.  Respiratory: Negative for dyspnea at rest, dyspnea on exertion, cough, sputum, wheezing.  GI:  See history of present illness. GU:  Negative for dysuria, hematuria, urinary incontinence, urinary frequency, nocturnal urination.  Endo: Negative for unusual weight change.    Physical Examination:   BP 79/47 mmHg  Pulse 52  Temp(Src) 97.6 F (36.4 C) (Oral)  Ht _0  (1.549 m)  Wt 198 lb (89.812 kg)  BMI 37.43 kg/m2  General: Well-nourished, well-developed in no acute distress.  Eyes: No icterus. Conjunctivae pink. Neuro: Alert and oriented x 3.  Grossly intact. Skin: Warm and dry, no jaundice.   Psych: Alert and cooperative, normal mood and affect.  Labs:    Imaging Studies: No results found.  Assessment and Plan:   Yvonne Mcdaniel is a 69 y.o. y/o female who  has hepatitis C with cirrhosis and hepatocellular carcinoma. The patient has questions today about what she can eat and if she should start treatment for her hepatitis C. The patient has been told to proceed with treatment for her hepatocellular carcinoma and if that goes well with ablation of the lesions then she should then follow with me for consideration of treatment for her hepatitis C. The patient will also have labs sent off to look for other abnormalities that could be causing her cirrhosis and also for her immunity to hepatitis A and hepatitis B. Pending these tests she may need vaccinations for those 2 viruses.   Note: This dictation was prepared with Dragon dictation along with smaller phrase technology. Any transcriptional errors that result from this process are unintentional.

## 2015-10-02 LAB — HEPATITIS B SURFACE ANTIGEN: Hepatitis B Surface Ag: NEGATIVE

## 2015-10-02 LAB — HEPATITIS A ANTIBODY, TOTAL: Hep A Total Ab: POSITIVE — AB

## 2015-10-04 LAB — HEPATITIS C GENOTYPE

## 2015-10-07 LAB — HCV RT-PCR, QUANT (NON-GRAPH)
HCV QUANT LOG: 5.672 {Log_copies}/mL
HCV QUANTITATIVE BASELINE: 188000 IU/mL
HCV Quantitative: 470000 Copies/mL
IU log10: 5.274 Log10 IU/mL

## 2015-10-09 ENCOUNTER — Telehealth: Payer: Self-pay

## 2015-10-09 NOTE — Telephone Encounter (Signed)
LVM for pt to return my call regarding lab results.   

## 2015-10-09 NOTE — Telephone Encounter (Signed)
-----   Message from Lucilla Lame, MD sent at 10/02/2015  7:51 AM EST ----- Let the patient know the she does not need a Hep A vaccine. We are still waiting for the hep B.

## 2015-10-09 NOTE — Telephone Encounter (Signed)
-----   Message from Lucilla Lame, MD sent at 10/04/2015  2:17 PM EST ----- The patient's genotype was 1A and she should be started on treatment as soon as the rest of her labs are back.

## 2015-10-09 NOTE — Telephone Encounter (Signed)
Pt has been notified of lab results. Pt has an appt at [facility] to start treatment for liver cancer. Pt will complete this and call back when she is ready to start treatment. Most likely next year.

## 2015-10-13 ENCOUNTER — Ambulatory Visit (INDEPENDENT_AMBULATORY_CARE_PROVIDER_SITE_OTHER): Payer: Medicare Other

## 2015-10-13 ENCOUNTER — Ambulatory Visit (INDEPENDENT_AMBULATORY_CARE_PROVIDER_SITE_OTHER): Payer: Medicare Other | Admitting: Podiatry

## 2015-10-13 ENCOUNTER — Encounter: Payer: Self-pay | Admitting: Podiatry

## 2015-10-13 VITALS — BP 143/58 | HR 57 | Resp 16

## 2015-10-13 DIAGNOSIS — E119 Type 2 diabetes mellitus without complications: Secondary | ICD-10-CM

## 2015-10-13 DIAGNOSIS — B351 Tinea unguium: Secondary | ICD-10-CM

## 2015-10-13 DIAGNOSIS — E1142 Type 2 diabetes mellitus with diabetic polyneuropathy: Secondary | ICD-10-CM | POA: Diagnosis not present

## 2015-10-13 DIAGNOSIS — M79606 Pain in leg, unspecified: Secondary | ICD-10-CM | POA: Diagnosis not present

## 2015-10-13 NOTE — Progress Notes (Signed)
   Subjective:    Patient ID: Yvonne Mcdaniel, female    DOB: [birth year], 69 y.o.   MRN: [id number]  HPI: She presents today on referral from her primary doctor with diabetes mellitus for routine check of her bilateral lower extremity. She states that her nails are long and need debridement. She also has a wide forefoot.    Review of Systems  HENT: Positive for hearing loss and sinus pressure.   Respiratory: Positive for shortness of breath.   Musculoskeletal: Positive for back pain.  All other systems reviewed and are negative.      Objective:   Physical Exam: Vital signs are stable though she does demonstrate some mild hypertension today pulses are strongly palpable in no apparent distress. Neurologic sensorium is slightly decreased per Semmes-Weinstein monofilament. Vibratory sensation is intact. Deep tendon reflexes are intact bilateral and muscle strength is 5 over 5 dorsiflexion plantar flexors and inverters and everters intact bilateral. Muscle strength is normal bilateral orthopedic evaluation of his mild flexible hammertoe deformities hallux valgus deformities bilateral. Cutaneous evaluation demonstrates no skin breakdown but does demonstrate thick dystrophic clinic with mycotic nails with sharp and rated at margins.        Assessment & Plan:  Assessment: Diabetes with early diabetic peripheral neuropathy. Pain in limb secondary to onychomycosis bilateral.  Plan: Debridement of nails 1 through 5 bilateral covered service secondary to pain. Follow-up with her in 3 months.

## 2016-01-12 ENCOUNTER — Ambulatory Visit: Payer: Medicare Other | Admitting: Podiatry

## 2016-02-02 ENCOUNTER — Ambulatory Visit: Payer: Medicare Other | Admitting: Podiatry

## 2016-02-17 ENCOUNTER — Ambulatory Visit (INDEPENDENT_AMBULATORY_CARE_PROVIDER_SITE_OTHER): Payer: Medicare Other | Admitting: Obstetrics and Gynecology

## 2016-02-17 ENCOUNTER — Encounter: Payer: Self-pay | Admitting: Obstetrics and Gynecology

## 2016-02-17 ENCOUNTER — Other Ambulatory Visit: Payer: Self-pay | Admitting: Obstetrics and Gynecology

## 2016-02-17 VITALS — BP 87/49 | HR 58 | Ht 61.0 in | Wt 191.8 lb

## 2016-02-17 DIAGNOSIS — Z8742 Personal history of other diseases of the female genital tract: Secondary | ICD-10-CM

## 2016-02-17 DIAGNOSIS — R87619 Unspecified abnormal cytological findings in specimens from cervix uteri: Secondary | ICD-10-CM | POA: Diagnosis not present

## 2016-02-17 DIAGNOSIS — I959 Hypotension, unspecified: Secondary | ICD-10-CM | POA: Diagnosis not present

## 2016-02-17 DIAGNOSIS — Z124 Encounter for screening for malignant neoplasm of cervix: Secondary | ICD-10-CM | POA: Diagnosis not present

## 2016-02-17 NOTE — Progress Notes (Signed)
   GYNECOLOGY CLINIC PROGRESS NOTE  Subjective:     Yvonne Mcdaniel is a 70 y.o. woman who comes in today for a  pap smear only. Her most recent annual exam was on  06/2015 by PCP. 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 Previous abnormal Pap smears: yes - HGSIL s/p LEEP for positive ECC on colposcopy 03/2014. Contraception: post menopausal status  The following portions of the patient's history were reviewed and updated as appropriate: allergies, current medications, past family history, past medical history, past social history, past surgical history and problem list.  Review of Systems A comprehensive review of systems was negative.   Objective:    BP 87/49 mmHg  Pulse 58  Ht 5\' 1"  (1.549 m)  Wt 191 lb 12.8 oz (87 kg)  BMI 36.26 kg/m2 Pelvic Exam: external genitalia normal, positive findings: vaginal mucosa atrophic and vagina normal without discharge.  Cervix flushed to vaginal wall and s/p LEEP changes. Pap smear obtained.   Assessment:    Screening pap smear.   H/o abnormal pap smears  Hypotension  Plan:    Follow up in 1 year, or as indicated by Pap results.   Patient denies complaints of hypotension (i.e. dizziness, weakness).  Given precautions.  Has not take BP meds today.  Patient has another doctor appointment later this afternoon.  If BPs still significantly low, recommend informing PCP.  Recommend adequate hydration (as patient also reports recent admission for dehydration).    Rubie Maid, MD [facility]

## 2016-02-23 ENCOUNTER — Telehealth: Payer: Self-pay

## 2016-02-23 LAB — PAP IG AND HPV HIGH-RISK
HPV, high-risk: POSITIVE — AB
PAP SMEAR COMMENT: 0

## 2016-02-23 NOTE — Telephone Encounter (Signed)
-----   Message from Rubie Maid, MD sent at 02/23/2016 11:48 AM EDT ----- Please inform patient that her pap smear had insufficient cells (likely due to changes in cervix after previous LEEP). Would recommend repeat pap smear at patient's convenience.  Also, was HPV+.

## 2016-02-23 NOTE — Telephone Encounter (Signed)
Called pt no answer, LM for pt informing her of the need to come back in and repeat PAP.

## 2016-03-03 ENCOUNTER — Ambulatory Visit: Payer: Medicare Other | Admitting: Obstetrics and Gynecology

## 2016-04-06 ENCOUNTER — Encounter: Payer: Self-pay | Admitting: Obstetrics and Gynecology

## 2016-04-06 ENCOUNTER — Ambulatory Visit (INDEPENDENT_AMBULATORY_CARE_PROVIDER_SITE_OTHER): Payer: Medicare Other | Admitting: Obstetrics and Gynecology

## 2016-04-06 VITALS — BP 159/69 | HR 69 | Ht 61.0 in | Wt 192.7 lb

## 2016-04-06 DIAGNOSIS — Z8742 Personal history of other diseases of the female genital tract: Secondary | ICD-10-CM | POA: Diagnosis not present

## 2016-04-06 DIAGNOSIS — R300 Dysuria: Secondary | ICD-10-CM

## 2016-04-06 DIAGNOSIS — R87615 Unsatisfactory cytologic smear of cervix: Secondary | ICD-10-CM | POA: Diagnosis not present

## 2016-04-06 LAB — POCT URINALYSIS DIPSTICK
BILIRUBIN UA: NEGATIVE
GLUCOSE UA: NEGATIVE
Ketones, UA: NEGATIVE
NITRITE UA: NEGATIVE
PH UA: 6.5
Protein, UA: NEGATIVE
Spec Grav, UA: 1.01
Urobilinogen, UA: NEGATIVE

## 2016-04-06 NOTE — Progress Notes (Signed)
   GYNECOLOGY CLINIC PROGRESS NOTE  Subjective:     Yvonne Mcdaniel is a 70 y.o. woman who comes in today for a repeat  pap smear only. Her most recent annual exam was on  06/2015 by PCP. 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 Previous abnormal Pap smears: yes - HGSIL s/p LEEP for positive ECC on colposcopy 03/2014. Contraception: post menopausal status.  Pap was performed 02/17/16, however was resulted as insufficient cells but HPV+.  Comes to repeat pap smear today.   The following portions of the patient's history were reviewed and updated as appropriate: allergies, current medications, past family history, past medical history, past social history, past surgical history and problem list.  Review of Systems A comprehensive review of systems was negative except for: Genitourinary: positive for dysuria x 2 days  Objective:    BP 159/69 mmHg  Pulse 69  Ht 5\' 1"  (1.549 m)  Wt 192 lb 11.2 oz (87.408 kg)  BMI 36.43 kg/m2 General Appearance: NAD Abdomen: soft, nontender. No CVA tenderness Pelvic Exam: external genitalia normal, positive findings: vaginal mucosa atrophic and vagina normal without discharge.  Cervix completely flushed to vaginal wall and s/p LEEP changes. Pap smear obtained.    Labs:  Urine dipstick shows negative for all components except small amount of blood.     Assessment:    Screening pap smear.   H/o abnormal pap smears  Prior pap smear with insufficient cells Dysuria  Plan:    Follow up in 1 year, or as indicated by Pap results.   Patient denies complaints of hypotension (i.e. dizziness, weakness).  Given precautions.  Has not take BP meds today.  Patient has another doctor appointment later this afternoon.  If BPs still significantly low, recommend informing PCP.  Recommend adequate hydration (as patient also reports recent admission for dehydration).  UA  performed, no nitrites but small amount of blood. Will send for culture.  Given samples of Urogesic blue, encouraged adequate hydration.    Rubie Maid, MD [facility]

## 2016-04-07 LAB — URINE CULTURE: Organism ID, Bacteria: NO GROWTH

## 2016-04-08 LAB — PAP IG AND HPV HIGH-RISK
HPV, HIGH-RISK: POSITIVE — AB
PAP Smear Comment: 0

## 2016-04-15 ENCOUNTER — Telehealth: Payer: Self-pay

## 2016-04-15 NOTE — Telephone Encounter (Signed)
Called pt, no answer. LM for pt informing her of the need for Colpo and to call back and schedule appt.

## 2016-04-15 NOTE — Telephone Encounter (Signed)
-----   Message from Rubie Maid, MD sent at 04/09/2016  9:44 AM EDT ----- Please inform of LGSIL pap.  Patient will need a colposcopy again.

## 2016-05-25 ENCOUNTER — Ambulatory Visit (INDEPENDENT_AMBULATORY_CARE_PROVIDER_SITE_OTHER): Payer: Medicare Other | Admitting: Obstetrics and Gynecology

## 2016-05-25 VITALS — BP 133/56 | HR 80 | Ht 61.0 in | Wt 190.8 lb

## 2016-05-25 DIAGNOSIS — Z9889 Other specified postprocedural states: Secondary | ICD-10-CM

## 2016-05-25 DIAGNOSIS — R87612 Low grade squamous intraepithelial lesion on cytologic smear of cervix (LGSIL): Secondary | ICD-10-CM | POA: Diagnosis not present

## 2016-05-25 NOTE — Progress Notes (Addendum)
    GYNECOLOGY CLINIC COLPOSCOPY PROCEDURE NOTE   Yvonne Mcdaniel is a 70 y.o. female here for colposcopy for low-grade squamous intraepithelial neoplasia (LGSIL - encompassing HPV,mild dysplasia,CIN I) pap smear on 04/2016. 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 Previous abnormal Pap smears: yes - HGSIL s/p LEEP for positive ECC on colposcopy 03/2014. Contraception: post menopausal status. Discussed role for HPV in cervical dysplasia, need for surveillance.  Patient given informed consent, signed copy in the chart, time out was performed.  Placed in lithotomy position. Cervix viewed with speculum and colposcope after application of acetic acid.   Colposcopy adequate? Yes  no visible lesions; no biopsies obtained.  ECC specimen not obtained as cervix flushed to vaginal wall with no visible os.    Patient was given post procedure instructions.  Will follow up pathology and manage accordingly.  Routine preventative health maintenance measures emphasized.   Rubie Maid, MD [facility]

## 2016-05-31 ENCOUNTER — Encounter: Payer: Self-pay | Admitting: Obstetrics and Gynecology

## 2016-06-01 ENCOUNTER — Encounter: Payer: Self-pay | Admitting: Obstetrics and Gynecology

## 2016-06-21 ENCOUNTER — Telehealth: Payer: Self-pay | Admitting: Obstetrics and Gynecology

## 2016-06-21 NOTE — Telephone Encounter (Signed)
Returned Call 

## 2016-06-21 NOTE — Telephone Encounter (Signed)
PT CALLED AND SHE HAS A PERSONAL QUESTIONS SHE WOULD LIKE TO ASK YOU WHEN YOU HAVE CHANCE.

## 2016-06-24 ENCOUNTER — Ambulatory Visit
Admission: RE | Admit: 2016-06-24 | Discharge: 2016-06-24 | Disposition: A | Discharge status: Home / Self Care | Payer: Medicare Other | Source: Ambulatory Visit | Attending: Internal Medicine | Admitting: Internal Medicine

## 2016-06-24 ENCOUNTER — Other Ambulatory Visit: Payer: Self-pay | Admitting: Internal Medicine

## 2016-06-24 DIAGNOSIS — Z1231 Encounter for screening mammogram for malignant neoplasm of breast: Secondary | ICD-10-CM | POA: Insufficient documentation

## 2016-08-04 ENCOUNTER — Telehealth: Payer: Self-pay

## 2016-08-09 NOTE — Telephone Encounter (Signed)
Dr. Patsy Baltimore (Transplant Dept. At [facility]) office called inquiring of colposcopy biopsy results from 05/25/2016.  In the procedure note it was written: No visible lesions; corresponding biopsies obtained and they wanted the pathology for these specimens. I could not find these in chart and I asked Dr. Marcelline Mates if she recalls this. She states this was an error in documentation and biopsies were not obtained.  Dr. Patsy Baltimore office would like a letter stating this so this can be closed in her chart.  The letter can be faxed to: Transplant Department, ATT: Dr. Patsy Baltimore.  FAX number: [phone].

## 2016-09-15 ENCOUNTER — Encounter: Payer: Self-pay | Admitting: Obstetrics and Gynecology

## 2016-11-02 ENCOUNTER — Emergency Department: Payer: Medicare Other

## 2016-11-02 ENCOUNTER — Inpatient Hospital Stay
Admission: EM | Admit: 2016-11-02 | Discharge: 2016-11-04 | DRG: 871 | Disposition: A | Discharge status: Other Acute Inpatient Hospital | Payer: Medicare Other | Attending: Internal Medicine | Admitting: Internal Medicine

## 2016-11-02 DIAGNOSIS — E119 Type 2 diabetes mellitus without complications: Secondary | ICD-10-CM | POA: Diagnosis present

## 2016-11-02 DIAGNOSIS — I2510 Atherosclerotic heart disease of native coronary artery without angina pectoris: Secondary | ICD-10-CM | POA: Diagnosis present

## 2016-11-02 DIAGNOSIS — C220 Liver cell carcinoma: Secondary | ICD-10-CM | POA: Diagnosis present

## 2016-11-02 DIAGNOSIS — J9602 Acute respiratory failure with hypercapnia: Secondary | ICD-10-CM | POA: Diagnosis present

## 2016-11-02 DIAGNOSIS — Z01818 Encounter for other preprocedural examination: Secondary | ICD-10-CM

## 2016-11-02 DIAGNOSIS — R4182 Altered mental status, unspecified: Secondary | ICD-10-CM | POA: Diagnosis present

## 2016-11-02 DIAGNOSIS — A419 Sepsis, unspecified organism: Principal | ICD-10-CM | POA: Diagnosis present

## 2016-11-02 DIAGNOSIS — J189 Pneumonia, unspecified organism: Secondary | ICD-10-CM | POA: Diagnosis present

## 2016-11-02 DIAGNOSIS — R6521 Severe sepsis with septic shock: Secondary | ICD-10-CM | POA: Diagnosis present

## 2016-11-02 DIAGNOSIS — Z7982 Long term (current) use of aspirin: Secondary | ICD-10-CM

## 2016-11-02 DIAGNOSIS — Z9049 Acquired absence of other specified parts of digestive tract: Secondary | ICD-10-CM | POA: Diagnosis not present

## 2016-11-02 DIAGNOSIS — Z79899 Other long term (current) drug therapy: Secondary | ICD-10-CM

## 2016-11-02 DIAGNOSIS — Z955 Presence of coronary angioplasty implant and graft: Secondary | ICD-10-CM

## 2016-11-02 DIAGNOSIS — Z794 Long term (current) use of insulin: Secondary | ICD-10-CM | POA: Diagnosis not present

## 2016-11-02 DIAGNOSIS — R0600 Dyspnea, unspecified: Secondary | ICD-10-CM

## 2016-11-02 DIAGNOSIS — Z87891 Personal history of nicotine dependence: Secondary | ICD-10-CM

## 2016-11-02 DIAGNOSIS — J9600 Acute respiratory failure, unspecified whether with hypoxia or hypercapnia: Secondary | ICD-10-CM

## 2016-11-02 DIAGNOSIS — D638 Anemia in other chronic diseases classified elsewhere: Secondary | ICD-10-CM | POA: Diagnosis present

## 2016-11-02 DIAGNOSIS — G9341 Metabolic encephalopathy: Secondary | ICD-10-CM | POA: Diagnosis not present

## 2016-11-02 DIAGNOSIS — N179 Acute kidney failure, unspecified: Secondary | ICD-10-CM | POA: Diagnosis present

## 2016-11-02 DIAGNOSIS — R400 Somnolence: Secondary | ICD-10-CM | POA: Diagnosis not present

## 2016-11-02 DIAGNOSIS — I471 Supraventricular tachycardia: Secondary | ICD-10-CM | POA: Diagnosis present

## 2016-11-02 DIAGNOSIS — J9601 Acute respiratory failure with hypoxia: Secondary | ICD-10-CM | POA: Diagnosis present

## 2016-11-02 DIAGNOSIS — Z96649 Presence of unspecified artificial hip joint: Secondary | ICD-10-CM | POA: Diagnosis present

## 2016-11-02 DIAGNOSIS — Z7902 Long term (current) use of antithrombotics/antiplatelets: Secondary | ICD-10-CM | POA: Diagnosis not present

## 2016-11-02 DIAGNOSIS — R6520 Severe sepsis without septic shock: Secondary | ICD-10-CM | POA: Diagnosis not present

## 2016-11-02 DIAGNOSIS — B182 Chronic viral hepatitis C: Secondary | ICD-10-CM | POA: Diagnosis present

## 2016-11-02 DIAGNOSIS — Z452 Encounter for adjustment and management of vascular access device: Secondary | ICD-10-CM

## 2016-11-02 DIAGNOSIS — I10 Essential (primary) hypertension: Secondary | ICD-10-CM | POA: Diagnosis present

## 2016-11-02 DIAGNOSIS — E872 Acidosis: Secondary | ICD-10-CM | POA: Diagnosis present

## 2016-11-02 DIAGNOSIS — K7460 Unspecified cirrhosis of liver: Secondary | ICD-10-CM | POA: Diagnosis present

## 2016-11-02 DIAGNOSIS — R401 Stupor: Secondary | ICD-10-CM | POA: Diagnosis not present

## 2016-11-02 DIAGNOSIS — Z79891 Long term (current) use of opiate analgesic: Secondary | ICD-10-CM

## 2016-11-02 DIAGNOSIS — K7290 Hepatic failure, unspecified without coma: Secondary | ICD-10-CM | POA: Diagnosis present

## 2016-11-02 DIAGNOSIS — Z4659 Encounter for fitting and adjustment of other gastrointestinal appliance and device: Secondary | ICD-10-CM

## 2016-11-02 LAB — URINALYSIS, COMPLETE (UACMP) WITH MICROSCOPIC
BILIRUBIN URINE: NEGATIVE
Bacteria, UA: NONE SEEN
Glucose, UA: NEGATIVE mg/dL
KETONES UR: NEGATIVE mg/dL
LEUKOCYTES UA: NEGATIVE
Nitrite: NEGATIVE
PROTEIN: 100 mg/dL — AB
Specific Gravity, Urine: 1.02 (ref 1.005–1.030)
pH: 5 (ref 5.0–8.0)

## 2016-11-02 LAB — COMPREHENSIVE METABOLIC PANEL
ALT: 61 U/L — AB (ref 14–54)
ANION GAP: 13 (ref 5–15)
AST: 110 U/L — ABNORMAL HIGH (ref 15–41)
Albumin: 2.8 g/dL — ABNORMAL LOW (ref 3.5–5.0)
Alkaline Phosphatase: 174 U/L — ABNORMAL HIGH (ref 38–126)
BUN: 15 mg/dL (ref 6–20)
CHLORIDE: 110 mmol/L (ref 101–111)
CO2: 19 mmol/L — AB (ref 22–32)
CREATININE: 0.95 mg/dL (ref 0.44–1.00)
Calcium: 9 mg/dL (ref 8.9–10.3)
GFR calc non Af Amer: >60 mL/min (ref >60)
Glucose, Bld: 134 mg/dL — ABNORMAL HIGH (ref 65–99)
POTASSIUM: 3.7 mmol/L (ref 3.5–5.1)
SODIUM: 142 mmol/L (ref 135–145)
Total Bilirubin: 4 mg/dL — ABNORMAL HIGH (ref 0.3–1.2)
Total Protein: 7 g/dL (ref 6.5–8.1)

## 2016-11-02 LAB — TROPONIN I
Troponin I: 0.48 ng/mL (ref <0.03)
Troponin I: 0.76 ng/mL (ref <0.03)
Troponin I: 0.91 ng/mL (ref <0.03)

## 2016-11-02 LAB — AMMONIA: Ammonia: 36 umol/L — ABNORMAL HIGH (ref 9–35)

## 2016-11-02 LAB — GLUCOSE, CAPILLARY: GLUCOSE-CAPILLARY: 158 mg/dL — AB (ref 65–99)

## 2016-11-02 LAB — CK: Total CK: 91 U/L (ref 38–234)

## 2016-11-02 LAB — BLOOD GAS, VENOUS
ACID-BASE DEFICIT: 8.1 mmol/L — AB (ref 0.0–2.0)
BICARBONATE: 17 mmol/L — AB (ref 20.0–28.0)
O2 Saturation: 63.2 %
PATIENT TEMPERATURE: 37
PO2 VEN: 36 mmHg (ref 32.0–45.0)
pCO2, Ven: 33 mmHg — ABNORMAL LOW (ref 44.0–60.0)
pH, Ven: 7.32 (ref 7.250–7.430)

## 2016-11-02 LAB — CBC WITH DIFFERENTIAL/PLATELET
Basophils Absolute: 0.1 10*3/uL (ref 0–0.1)
Basophils Relative: 1 %
Eosinophils Absolute: 0 10*3/uL (ref 0–0.7)
Eosinophils Relative: 0 %
HEMATOCRIT: 34.5 % — AB (ref 35.0–47.0)
HEMOGLOBIN: 11.6 g/dL — AB (ref 12.0–16.0)
LYMPHS PCT: 5 %
Lymphs Abs: 0.7 10*3/uL — ABNORMAL LOW (ref 1.0–3.6)
MCH: 33.8 pg (ref 26.0–34.0)
MCHC: 33.7 g/dL (ref 32.0–36.0)
MCV: 100.5 fL — AB (ref 80.0–100.0)
Monocytes Absolute: 0.7 10*3/uL (ref 0.2–0.9)
Monocytes Relative: 5 %
NEUTROS ABS: 11.7 10*3/uL — AB (ref 1.4–6.5)
NEUTROS PCT: 89 %
Platelets: 133 10*3/uL — ABNORMAL LOW (ref 150–440)
RBC: 3.43 MIL/uL — AB (ref 3.80–5.20)
RDW: 17.5 % — ABNORMAL HIGH (ref 11.5–14.5)
WBC: 13.3 10*3/uL — AB (ref 3.6–11.0)

## 2016-11-02 LAB — LACTIC ACID, PLASMA
LACTIC ACID, VENOUS: 9 mmol/L — AB (ref 0.5–1.9)
LACTIC ACID, VENOUS: 9.3 mmol/L — AB (ref 0.5–1.9)
LACTIC ACID, VENOUS: 6.8 mmol/L — AB (ref 0.5–1.9)

## 2016-11-02 MED ORDER — SODIUM CHLORIDE 0.9 % IV BOLUS (SEPSIS)
1000.0000 mL | Freq: Once | INTRAVENOUS | Status: AC
  Administered 2016-11-02: 1000 mL via INTRAVENOUS

## 2016-11-02 MED ORDER — DILTIAZEM HCL 100 MG IV SOLR
5.0000 mg/h | INTRAVENOUS | Status: DC
  Administered 2016-11-02: 5 mg/h via INTRAVENOUS
  Administered 2016-11-03 (×2): 10 mg/h via INTRAVENOUS
  Filled 2016-11-02 (×3): qty 100

## 2016-11-02 MED ORDER — INSULIN ASPART 100 UNIT/ML ~~LOC~~ SOLN: 0.0000 [IU] | Freq: Every day | SUBCUTANEOUS | Status: DC

## 2016-11-02 MED ORDER — ADENOSINE 12 MG/4ML IV SOLN
INTRAVENOUS | Status: AC
  Administered 2016-11-02: 12 mg via INTRAVENOUS
  Filled 2016-11-02: qty 4

## 2016-11-02 MED ORDER — ADENOSINE 6 MG/2ML IV SOLN
INTRAVENOUS | Status: AC
  Administered 2016-11-02: 6 mg via INTRAVENOUS
  Filled 2016-11-02: qty 2

## 2016-11-02 MED ORDER — INSULIN ASPART 100 UNIT/ML ~~LOC~~ SOLN
0.0000 [IU] | Freq: Three times a day (TID) | SUBCUTANEOUS | Status: DC
Start: 2016-11-03 — End: 2016-11-03
  Administered 2016-11-03: 2 [IU] via SUBCUTANEOUS
  Filled 2016-11-02: qty 2

## 2016-11-02 MED ORDER — LORAZEPAM 2 MG/ML IJ SOLN
1.0000 mg | Freq: Once | INTRAMUSCULAR | Status: AC
  Administered 2016-11-02: 1 mg via INTRAVENOUS
  Filled 2016-11-02: qty 1

## 2016-11-02 MED ORDER — PIPERACILLIN-TAZOBACTAM 3.375 G IVPB 30 MIN
3.3750 g | Freq: Once | INTRAVENOUS | Status: AC
  Administered 2016-11-02: 3.375 g via INTRAVENOUS
  Filled 2016-11-02: qty 50

## 2016-11-02 MED ORDER — ONDANSETRON HCL 4 MG/2ML IJ SOLN
4.0000 mg | Freq: Once | INTRAMUSCULAR | Status: AC
  Administered 2016-11-02: 4 mg via INTRAVENOUS
  Filled 2016-11-02: qty 2

## 2016-11-02 MED ORDER — LACTULOSE ENEMA
300.0000 mL | Freq: Two times a day (BID) | ORAL | Status: DC
  Filled 2016-11-02 (×2): qty 300

## 2016-11-02 MED ORDER — IOPAMIDOL (ISOVUE-300) INJECTION 61%: 30.0000 mL | Freq: Once | INTRAVENOUS | Status: DC

## 2016-11-02 MED ORDER — VANCOMYCIN HCL IN DEXTROSE 1-5 GM/200ML-% IV SOLN
1000.0000 mg | Freq: Once | INTRAVENOUS | Status: AC
  Administered 2016-11-02: 1000 mg via INTRAVENOUS
  Filled 2016-11-02: qty 200

## 2016-11-02 MED ORDER — ADENOSINE 6 MG/2ML IV SOLN
6.0000 mg | Freq: Once | INTRAVENOUS | Status: AC
  Administered 2016-11-02: 6 mg via INTRAVENOUS

## 2016-11-02 MED ORDER — MORPHINE SULFATE (PF) 4 MG/ML IV SOLN
4.0000 mg | Freq: Once | INTRAVENOUS | Status: AC
  Administered 2016-11-02: 4 mg via INTRAVENOUS
  Filled 2016-11-02: qty 1

## 2016-11-02 MED ORDER — ADENOSINE 12 MG/4ML IV SOLN
12.0000 mg | Freq: Once | INTRAVENOUS | Status: AC
  Administered 2016-11-02: 12 mg via INTRAVENOUS

## 2016-11-02 MED ORDER — SODIUM CHLORIDE 0.9 % IV BOLUS (SEPSIS)
1000.0000 mL | Freq: Once | INTRAVENOUS | Status: AC
Start: 2016-11-02 — End: 2016-11-02
  Administered 2016-11-02: 1000 mL via INTRAVENOUS

## 2016-11-02 NOTE — ED Notes (Signed)
Olivia Mackie daughter [phone] Crystal daughter [phone] Sharyn Lull daughter [phone]

## 2016-11-02 NOTE — ED Triage Notes (Signed)
Pt comes into the ED via EMS from home with c/o pt lives alone and family had not heard from from here in a couple of days and sent a family friend to check on her, pt is alert but grunting , moaning.. Pt does not respond to questions.. Pt has light bruising with tenderness to the left brow and BL thighs.. Pt is normally a/ox4 lives and care for herself.Marland Kitchen

## 2016-11-02 NOTE — ED Notes (Signed)
Dr. Verdell Carmine wanted to do a swallow screen in attempt to change enema lactulose to oral lactulose. Pt was not able to swallow water. Pt was able to close her lips around the straw but did not suck the water through the straw.

## 2016-11-02 NOTE — Consult Note (Signed)
Pharmacy Antibiotic Note  Yvonne Mcdaniel is a 71 y.o. female admitted on 11/02/2016 with pneumonia and sepsis.  Pharmacy has been consulted for vancomycin/zosyn dosing.  Plan: Vancomycin 1g given in ED. Will give next dose in 6 hours for stacked dosing Vancomycin 750 IV every 12 hours.  Goal trough 15-20 mcg/mL. Zosyn 3.375g IV q8h (4 hour infusion). Trough prior to 5th dose, 1/4 @ 0930  Height: 5\' 3"  (160 cm) Weight: 180 lb (81.6 kg) IBW/kg (Calculated) : 52.4  Temp (24hrs), Avg:99.3 F (37.4 C), Min:98.6 F (37 C), Max:99.9 F (37.7 C)   Recent Labs Lab 11/02/16 1444 11/02/16 1810  WBC 13.3*  --   CREATININE 0.95  --   LATICACIDVEN 9.0* 9.3*    Estimated Creatinine Clearance: 57.4 mL/min (by C-G formula based on SCr of 0.95 mg/dL).    No Known Allergies  Antimicrobials this admission: vancomycin 1/2 >>  zosyn 1/2 >>   Dose adjustments this admission:   Microbiology results: 1/2 BCx:  1/2 UCx:     Thank you for allowing pharmacy to be a part of this patient's care.  Ramond Dial, Pharm.D, BCPS Clinical Pharmacist  11/02/2016 7:03 PM

## 2016-11-02 NOTE — ED Notes (Signed)
Pt taken to CT via stretcher.

## 2016-11-02 NOTE — ED Provider Notes (Signed)
[facility] Emergency Department Provider Note   L5 caveat: Review of systems and history is limited by altered mental status    Time seen: ----------------------------------------- 2:40 PM on 11/02/2016 -----------------------------------------    I have reviewed the triage vital signs and the nursing notes.   HISTORY  Chief Complaint No chief complaint on file.    HPI Yvonne Mcdaniel is a 71 y.o. female brought the ER by EMS for altered mental status. Reportedly she was found by friends or family who hadn't heard from her for a prolonged period time. She is too confused to give review of systems or report, she just moans stating "oh my gosh."    Past Medical History:  Diagnosis Date  . Cirrhosis of liver without ascites ([place])   . Hep C w/o coma, chronic ([place])   . History of blood transfusion    70's  . Hypertension     Patient Active Problem List   Diagnosis Date Noted  . Arthritis 10/01/2015  . BP (high blood pressure) 10/01/2015  . HCV (hepatitis C virus) 10/01/2015  . Anxiety 10/01/2015  . Encephalopathy, hepatic ([place]) 08/16/2015  . Coagulopathy ([place]) 08/16/2015  . Thrombocytopenia ([place]) 08/16/2015  . Diabetes mellitus ([place]) 08/16/2015  . Hereditary and idiopathic peripheral neuropathy 08/16/2015  . SVT (supraventricular tachycardia) ([place]) 08/16/2015  . Atrial flutter ([place]) 08/16/2015  . Cirrhosis of liver without ascites ([place])   . Severe sepsis ([place]) 08/11/2015  . Lactic acidosis 08/11/2015  . Acute kidney injury ([place]) 08/11/2015  . Elevated troponin 08/11/2015  . Hepatitis C virus carrier state ([place])   . Liver lesion   . Neuritis or radiculitis due to rupture of lumbar intervertebral disc 06/19/2014  . Degeneration of intervertebral disc of lumbar region 06/19/2014    Past Surgical History:  Procedure Laterality Date  . CESAREAN SECTION    . CHOLECYSTECTOMY    . NECK SURGERY    . TOTAL HIP ARTHROPLASTY    . TUBAL LIGATION       Allergies Patient has no known allergies.  Social History Social History  Substance Use Topics  . Smoking status: Former Research scientist (life sciences)  . Smokeless tobacco: Never Used  . Alcohol use No    Review of Systems Unknown at this time.  ____________________________________________   PHYSICAL EXAM:  VITAL SIGNS: ED Triage Vitals  Enc Vitals Group     BP      Pulse      Resp      Temp      Temp src      SpO2      Weight      Height      Head Circumference      Peak Flow      Pain Score      Pain Loc      Pain Edu?      Excl. in [place]?     Constitutional: Alert But disoriented, moderate distress  Eyes: Conjunctivae are normal. PERRL. Normal extraocular movements. ENT   Head: Normocephalic and atraumatic.   Nose: No congestion/rhinnorhea.   Mouth/Throat: Mucous membranes are moist.   Neck: No stridor. Cardiovascular: Rapid rate, regular rhythm. No murmurs, rubs, or gallops. Respiratory: Normal respiratory effort without tachypnea nor retractions. Breath sounds are clear and equal bilaterally. No wheezes/rales/rhonchi. Gastrointestinal: Soft and nontender. Normal bowel sounds Musculoskeletal: Nontender with normal range of motion in all extremities. No lower extremity tenderness nor edema. Neurologic:  Normal speech and language. Patient will not cooperate with  neurologic exam this time. She appears to be moving her extremities well. Skin:  Skin is warm, dry and intact. No rash noted. Hand appearance to the skin Psychiatric: Agitated mood and affect, speech appears normal   ____________________________________________  ED COURSE:  Pertinent labs & imaging results that were available during my care of the patient were reviewed by me and considered in my medical decision making (see chart for details). Clinical Course   Patient presents to the ER for altered mental status, possible sepsis. We will assess with septic protocol, check CK levels and ammonia levels as  well.  Procedures ____________________________________________   LABS (pertinent positives/negatives)  Labs Reviewed  CBC WITH DIFFERENTIAL/PLATELET - Abnormal; Notable for the following:       Result Value   WBC 13.3 (*)    RBC 3.43 (*)    Hemoglobin 11.6 (*)    HCT 34.5 (*)    MCV 100.5 (*)    RDW 17.5 (*)    Platelets 133 (*)    Neutro Abs 11.7 (*)    Lymphs Abs 0.7 (*)    All other components within normal limits  CULTURE, BLOOD (ROUTINE X 2)  CULTURE, BLOOD (ROUTINE X 2)  URINE CULTURE  LACTIC ACID, PLASMA  COMPREHENSIVE METABOLIC PANEL  TROPONIN I  BLOOD GAS, VENOUS  AMMONIA  CK    RADIOLOGY Images were viewed by me  Chest x-ray, CT head Is pending at this time ____________________________________________  FINAL ASSESSMENT AND PLAN  Altered mental status  Plan: Patient with labs and imaging as dictated above. Patient care checked out to Dr. Archie Balboa for final disposition.    Earleen Newport, MD   Note: This dictation was prepared with Dragon dictation. Any transcriptional errors that result from this process are unintentional    Earleen Newport, MD 11/02/16 [phone]

## 2016-11-02 NOTE — H&P (Addendum)
[facility] NAME: Yvonne Mcdaniel    MR#:  [id number]  DATE OF BIRTH:  1946-10-24  DATE OF ADMISSION:  11/02/2016  PRIMARY CARE PHYSICIAN: Perrin Maltese, MD   REQUESTING/REFERRING PHYSICIAN: Dr. Nance Pear  CHIEF COMPLAINT:   Chief Complaint  Patient presents with  . Altered Mental Status    HISTORY OF PRESENT ILLNESS:  Yvonne Mcdaniel  is a 71 y.o. female with a known history of Hepatitis C, liver cirrhosis secondary to hepatitis C, hypertension, diabetes, history of coronary artery disease status post recent stent placement who presented to the hospital due to altered mental status. Patient herself is quite encephalopathic therefore most of the history obtained from the family at bedside. As per the family patient is usually alert and oriented but they tried to call her the past couple days and she wouldn't pick up her phone. When he arrived at home patient was confused and repeating the same thing over and over again. She was not acting like herself and therefore brought to the ER for further evaluation. In the emergency room patient continues to be altered and was noted to be tachycardic, with an elevated lactic acid and chest x-ray findings suggestive of possible pneumonia and clinically thought to have sepsis and therefore hospitalist services were contacted further treatment and evaluation.  PAST MEDICAL HISTORY:   Past Medical History:  Diagnosis Date  . Cirrhosis of liver without ascites ([place])   . 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 History of blood transfusion    70's  . Hypertension     PAST SURGICAL HISTORY:   Past Surgical History:  Procedure Laterality Date  . CESAREAN SECTION    . CHOLECYSTECTOMY    . NECK SURGERY    . TOTAL HIP ARTHROPLASTY    . TUBAL LIGATION      SOCIAL HISTORY:   Social History  Substance Use Topics  . Smoking status: Former Research scientist (life sciences)  . Smokeless tobacco: Never Used  . Alcohol use No     FAMILY HISTORY:   Family History  Problem Relation Age of Onset  . Cancer Father   . Hypertension Other   . Cancer Other   . Heart disease Brother   . Diabetes Maternal Aunt   . Breast cancer Cousin 35    DRUG ALLERGIES:  No Known Allergies  REVIEW OF SYSTEMS:   Review of Systems  Unable to perform ROS: Mental acuity    MEDICATIONS AT HOME:   Prior to Admission medications   Medication Sig Start Date End Date Taking? Authorizing Provider  amLODipine (NORVASC) 5 MG tablet Take 1 tablet by mouth daily.   Yes Historical Provider, MD  aspirin EC 81 MG tablet Take 81 mg by mouth daily.   Yes Historical Provider, MD  clopidogrel (PLAVIX) 75 MG tablet Take 75 mg by mouth daily.   Yes Historical Provider, MD  furosemide (LASIX) 20 MG tablet TAKE 1 TABLET (20 MG TOTAL) BY MOUTH EVERY MORNING. 09/04/15  Yes Historical Provider, MD  gabapentin (NEURONTIN) 300 MG capsule Take 300 mg by mouth 3 (three) times daily.   Yes Historical Provider, MD  gentamicin (GARAMYCIN) 0.3 % ophthalmic ointment Place into both eyes 2 (two) times daily.   Yes Historical Provider, MD  hydrOXYzine (ATARAX/VISTARIL) 10 MG tablet Take 10 mg by mouth 3 (three) times daily as needed.   Yes Historical Provider, MD  insulin aspart (NOVOLOG) 100 UNIT/ML injection Inject 3 Units into  the skin 3 (three) times daily with meals. Patient taking differently: Inject 3-12 Units into the skin 3 (three) times daily with meals. 151-200=3 units 201-250=6units 251-300-9units 301-350=12units 08/15/15  Yes Henreitta Leber, MD  insulin glargine (LANTUS) 100 UNIT/ML injection Inject 0.15 mLs (15 Units total) into the skin at bedtime. 08/15/15  Yes Henreitta Leber, MD  oxyCODONE (OXY IR/ROXICODONE) 5 MG immediate release tablet Take 5 mg by mouth every 4 (four) hours as needed for severe pain.   Yes Historical Provider, MD  quinapril (ACCUPRIL) 20 MG tablet Take 1 tablet by mouth every morning.   Yes Historical Provider, MD   sotalol (BETAPACE) 80 MG tablet Take 1 tablet (80 mg total) by mouth 2 (two) times daily. 08/15/15  Yes Henreitta Leber, MD  spironolactone (ALDACTONE) 50 MG tablet TAKE 1 TABLET (50 MG TOTAL) BY MOUTH DAILY. 09/04/15  Yes Historical Provider, MD  tiZANidine (ZANAFLEX) 2 MG tablet Take 2-4 mg by mouth every 8 (eight) hours as needed for muscle spasms.   Yes Historical Provider, MD  lactulose (CHRONULAC) 10 GM/15ML solution Take 45 mLs (30 g total) by mouth 2 (two) times daily. 08/16/15   Theodoro Grist, MD      VITAL SIGNS:  Blood pressure (!) 187/100, pulse (!) 147, temperature 98.6 F (37 C), temperature source Oral, resp. rate (!) 27, height 5\' 3"  (1.6 m), weight 81.6 kg (180 lb), SpO2 100 %.  PHYSICAL EXAMINATION:  Physical Exam  GENERAL:  71 y.o.-year-old patient lying in the bed altered & encephalopathic EYES: Pupils equal, round, reactive to light and accommodation. No scleral icterus. Extraocular muscles intact.  HEENT: Head atraumatic, normocephalic. Oropharynx and nasopharynx clear. No oropharyngeal erythema, dry oral mucosa  NECK:  Supple, no jugular venous distention. No thyroid enlargement, no tenderness.  LUNGS: Normal breath sounds bilaterally, no wheezing, rales, rhonchi. No use of accessory muscles of respiration.  CARDIOVASCULAR: S1, S2 RRR. No murmurs, rubs, gallops, clicks.  ABDOMEN: Soft, nontender, nondistended. Bowel sounds present. No organomegaly or mass.  EXTREMITIES: No pedal edema, cyanosis, or clubbing. + 2 pedal & radial pulses b/l.   NEUROLOGIC: Cranial nerves II through XII are intact. No focal Motor or sensory deficits appreciated b/l. Globally weak and difficult to do a good Neuro exam due to Encephalopathy.  PSYCHIATRIC: The patient is alert and oriented x 1.  SKIN: No obvious rash, lesion, or ulcer.   LABORATORY PANEL:   CBC  Recent Labs Lab 11/02/16 1444  WBC 13.3*  HGB 11.6*  HCT 34.5*  PLT 133*    ------------------------------------------------------------------------------------------------------------------  Chemistries   Recent Labs Lab 11/02/16 1444  NA 142  K 3.7  CL 110  CO2 19*  GLUCOSE 134*  BUN 15  CREATININE 0.95  CALCIUM 9.0  AST 110*  ALT 61*  ALKPHOS 174*  BILITOT 4.0*   ------------------------------------------------------------------------------------------------------------------  Cardiac Enzymes  Recent Labs Lab 11/02/16 1444  TROPONINI 0.48*   ------------------------------------------------------------------------------------------------------------------  RADIOLOGY:  Ct Abdomen Pelvis Wo Contrast  Result Date: 11/02/2016 CLINICAL DATA:  Abdominal pain.  Recent liver stent placement EXAM: CT ABDOMEN AND PELVIS WITHOUT CONTRAST TECHNIQUE: Multidetector CT imaging of the abdomen and pelvis was performed following the standard protocol without IV contrast. COMPARISON:  08/11/2015 FINDINGS: Lower chest: Dense calcifications in the visualized right coronary artery. Lung bases clear. No effusions. Hepatobiliary: Severely shrunken, nodular liver compatible with cirrhosis. Large dilated recannulized umbilical vein compatible with portal venous hypertension. Prior cholecystectomy. Pancreas: No focal abnormality or ductal dilatation. Spleen: Spleen is normal caliber.  Small low-density lesion within the spleen is stable. Adrenals/Urinary Tract: No visible renal mass, stones or hydronephrosis. Adrenal glands unremarkable. Urinary bladder is obscured from beam hardening artifact from the left hip replacement. Stomach/Bowel: Normal appendix. Stomach, large and small bowel grossly unremarkable. Vascular/Lymphatic: Diffuse aortic and iliac calcifications. No aneurysm. No adenopathy. Reproductive: Uterus and adnexa unremarkable.  No mass. Other: No free fluid or free air. Musculoskeletal: Prior left hip replacement. Degenerative disc and facet disease throughout the  lumbar spine. No acute bony abnormality. IMPRESSION: Severely nodular, shrunken liver compatible with cirrhosis. Large dilated recannulized umbilical vein compatible with portal venous hypertension. No acute findings in the abdomen or pelvis. Coronary artery disease, aortic atherosclerosis. Electronically Signed   By: Rolm Baptise M.D.   On: 11/02/2016 16:59   Ct Head Wo Contrast  Result Date: 11/02/2016 CLINICAL DATA:  Altered mental status.  Fever. EXAM: CT HEAD WITHOUT CONTRAST TECHNIQUE: Contiguous axial images were obtained from the base of the skull through the vertex without intravenous contrast. COMPARISON:  August 11, 2015 FINDINGS: Brain: There is age related volume loss, stable. There is mild invagination of CSF into the sella. There is no intracranial mass, hemorrhage, extra-axial fluid collection, or midline shift. There is slight small vessel disease in the centra semiovale bilaterally. Gray-white compartments elsewhere appear normal. No acute infarct evident. Vascular: There is no hyperdense vessel. There is calcification in the carotid siphon regions bilaterally. There is also calcification in the distal vertebral artery on the left. Skull: The bony calvarium appears intact. Sinuses/Orbits: There is mucosal thickening in several ethmoid air cells bilaterally. There is also mild mucosal thickening in the right maxillary antrum. Other visualized paranasal sinuses are clear. Visualized orbits appear symmetric bilaterally. Other: Visualized mastoid air cells are clear. IMPRESSION: Mild periventricular small vessel disease. No intracranial mass, hemorrhage, or extra-axial fluid collection. No acute infarct evident. There is a mild degree of empty sella. There are foci of arterial vascular calcification. There is mild paranasal sinus disease. Electronically Signed   By: Lowella Grip III M.D.   On: 11/02/2016 15:00   Dg Chest Port 1 View  Result Date: 11/02/2016 CLINICAL DATA:  Altered mental  status today.  History of cirrhosis. EXAM: PORTABLE CHEST 1 VIEW COMPARISON:  08/10/2015 FINDINGS: The heart is enlarged. There is aortic atherosclerosis. Markings at the lung bases are slightly prominent. This could be due to a poor inspiration or mild basilar pneumonia. No dense consolidation or lobar collapse. Upper lungs are clear. IMPRESSION: Poor inspiration versus mild basilar pneumonia. Electronically Signed   By: Nelson Chimes M.D.   On: 11/02/2016 15:04     IMPRESSION AND PLAN:   71 year old female with past medical history of hepatitis C, SVT, diabetes, hypertension, history of recent cardiac stent placement with coronary artery disease who presented to the hospital due to altered mental status.   1. Altered mental status-etiology unclear but suspected to be secondary to metabolic encephalopathy secondary sepsis with possible underlying hepatic encephalopathy.  -We'll treat underlying sepsis with broad-spectrum IV antibiotics, also started on lactulose for hepatic encephalopathy and follow mental status. Next-patient's CT head is negative for any acute pathology.  2. Sepsis-patient meets criteria given the elevated lactic acid, tachycardia, chest x-ray finding is suggestive of pneumonia. -We'll follow blood, urine cultures. Empirically continue vancomycin, Zosyn.  3. SVT-patient has a previous history of SVT and currently rates are elevated due to underlying sepsis/encephalopathy. -Patient given some adenosine without much improvement in heart rate. We'll place the patient on a Cardizem  drip. -Patient is more awake we will resume her sotalol. If needed consider cardiology consult.  4. History of coronary artery disease status post recent stent placement-patient's troponins mildly elevated secondary to tachycardia and sepsis.  -we will cycle cardiac markers, continue aspirin, Plavix.  5. Diabetes type 2 without complication-patient is going to be nothing by mouth due to altered mental  status, will place on sliding scale insulin.  6. History of hepatitis C and liver cirrhosis-patient is currently encephalopathic with ammonia level was only 36. -we'll place on lactulose enemas and follow ammonia.    All the records are reviewed and case discussed with ED provider. Management plans discussed with the patient, family and they are in agreement.  CODE STATUS: Full code  TOTAL Critical Care TIME TAKING CARE OF THIS PATIENT: 50 minutes.    Henreitta Leber M.D on 11/02/2016 at 6:39 PM  Between 7am to 6pm - Pager - [phone]  After 6pm go to [email or web address] - password [email or web address] [place] [facility]  Office  [phone]  CC: Primary care physician; Perrin Maltese, MD

## 2016-11-02 NOTE — ED Notes (Addendum)
Pt had shunt placed before thanksgiving, family is worried about that. Last known well was Saturday by family. Pt will only state "oh gosh" and a mumbling noise that sounds like she is cold. Unsure if pt is in pain because pt does not answer questions. Pt is alert, will look at whomever is talking to her. Pt likes to roll from side to side in bed. Pt has green vomit a few times, one with EMS, one in room 3. Pt has been tachycardic on monitor since arrival. Family at bedside trying to calm pt and keep her in bed since she rolls quite often. Pt will cry after saying "oh gosh"

## 2016-11-02 NOTE — ED Notes (Signed)
Pt had vomited previously on gown, old dirty gown taken off pt and new clean gown replaced. Also replaced warm blankets.

## 2016-11-02 NOTE — ED Notes (Signed)
12mg  adenosine give by Minette Brine, RN, flushed by Winfred Burn. HR came down to 88, went back up to 127.

## 2016-11-02 NOTE — ED Notes (Signed)
Called pharmacy to get cardizem drip sent to ED.

## 2016-11-02 NOTE — ED Notes (Signed)
Olivia RN pushed 6mg  adenosine, flushed by [employer]. Lowest HR came down to 115 then right back up to 140.

## 2016-11-03 ENCOUNTER — Inpatient Hospital Stay: Payer: Medicare Other

## 2016-11-03 DIAGNOSIS — R6520 Severe sepsis without septic shock: Secondary | ICD-10-CM

## 2016-11-03 DIAGNOSIS — G9341 Metabolic encephalopathy: Secondary | ICD-10-CM

## 2016-11-03 DIAGNOSIS — K7460 Unspecified cirrhosis of liver: Secondary | ICD-10-CM

## 2016-11-03 DIAGNOSIS — A419 Sepsis, unspecified organism: Principal | ICD-10-CM

## 2016-11-03 DIAGNOSIS — R401 Stupor: Secondary | ICD-10-CM

## 2016-11-03 DIAGNOSIS — R400 Somnolence: Secondary | ICD-10-CM

## 2016-11-03 LAB — URINE CULTURE: Culture: NO GROWTH

## 2016-11-03 LAB — BLOOD CULTURE ID PANEL (REFLEXED)
ACINETOBACTER BAUMANNII: NOT DETECTED
Acinetobacter baumannii: NOT DETECTED
CANDIDA ALBICANS: NOT DETECTED
CANDIDA ALBICANS: NOT DETECTED
CANDIDA KRUSEI: NOT DETECTED
CANDIDA PARAPSILOSIS: NOT DETECTED
CANDIDA TROPICALIS: NOT DETECTED
Candida glabrata: NOT DETECTED
Candida glabrata: NOT DETECTED
Candida krusei: NOT DETECTED
Candida parapsilosis: NOT DETECTED
Candida tropicalis: NOT DETECTED
ENTEROBACTER CLOACAE COMPLEX: NOT DETECTED
ENTEROBACTER CLOACAE COMPLEX: NOT DETECTED
ENTEROBACTERIACEAE SPECIES: NOT DETECTED
ENTEROBACTERIACEAE SPECIES: NOT DETECTED
ENTEROCOCCUS SPECIES: NOT DETECTED
ENTEROCOCCUS SPECIES: NOT DETECTED
ESCHERICHIA COLI: NOT DETECTED
Escherichia coli: NOT DETECTED
HAEMOPHILUS INFLUENZAE: NOT DETECTED
Haemophilus influenzae: NOT DETECTED
KLEBSIELLA OXYTOCA: NOT DETECTED
KLEBSIELLA PNEUMONIAE: NOT DETECTED
Klebsiella oxytoca: NOT DETECTED
Klebsiella pneumoniae: NOT DETECTED
LISTERIA MONOCYTOGENES: NOT DETECTED
LISTERIA MONOCYTOGENES: NOT DETECTED
METHICILLIN RESISTANCE: NOT DETECTED
Neisseria meningitidis: NOT DETECTED
Neisseria meningitidis: NOT DETECTED
PROTEUS SPECIES: NOT DETECTED
PSEUDOMONAS AERUGINOSA: NOT DETECTED
Proteus species: NOT DETECTED
Pseudomonas aeruginosa: NOT DETECTED
SERRATIA MARCESCENS: NOT DETECTED
STAPHYLOCOCCUS SPECIES: DETECTED — AB
STREPTOCOCCUS PNEUMONIAE: NOT DETECTED
STREPTOCOCCUS PYOGENES: NOT DETECTED
STREPTOCOCCUS SPECIES: NOT DETECTED
Serratia marcescens: NOT DETECTED
Staphylococcus aureus (BCID): NOT DETECTED
Staphylococcus aureus (BCID): NOT DETECTED
Staphylococcus species: NOT DETECTED
Streptococcus agalactiae: NOT DETECTED
Streptococcus agalactiae: NOT DETECTED
Streptococcus pneumoniae: NOT DETECTED
Streptococcus pyogenes: NOT DETECTED
Streptococcus species: NOT DETECTED

## 2016-11-03 LAB — MAGNESIUM: MAGNESIUM: 1.4 mg/dL — AB (ref 1.7–2.4)

## 2016-11-03 LAB — BLOOD GAS, ARTERIAL
ALLENS TEST (PASS/FAIL): POSITIVE — AB
Acid-base deficit: 4.3 mmol/L — ABNORMAL HIGH (ref 0.0–2.0)
Acid-base deficit: 5.7 mmol/L — ABNORMAL HIGH (ref 0.0–2.0)
BICARBONATE: 24.4 mmol/L (ref 20.0–28.0)
Bicarbonate: 20.1 mmol/L (ref 20.0–28.0)
FIO2: 0.4
FIO2: 100
O2 Saturation: 88.1 %
O2 Saturation: 100 %
PCO2 ART: 64 mmHg — AB (ref 32.0–48.0)
PEEP: 5 cmH2O
PO2 ART: 68 mmHg — AB (ref 83.0–108.0)
Patient temperature: 37
Patient temperature: 37
RATE: 16 resp/min
VT: 500 mL
pCO2 arterial: 40 mmHg (ref 32.0–48.0)
pH, Arterial: 7.19 — CL (ref 7.350–7.450)
pH, Arterial: 7.31 — ABNORMAL LOW (ref 7.350–7.450)
pO2, Arterial: 385 mmHg — ABNORMAL HIGH (ref 83.0–108.0)

## 2016-11-03 LAB — CBC
HEMATOCRIT: 30.7 % — AB (ref 35.0–47.0)
Hemoglobin: 10.4 g/dL — ABNORMAL LOW (ref 12.0–16.0)
MCH: 34 pg (ref 26.0–34.0)
MCHC: 33.8 g/dL (ref 32.0–36.0)
MCV: 100.8 fL — AB (ref 80.0–100.0)
Platelets: 109 10*3/uL — ABNORMAL LOW (ref 150–440)
RBC: 3.05 MIL/uL — ABNORMAL LOW (ref 3.80–5.20)
RDW: 17.5 % — AB (ref 11.5–14.5)
WBC: 16.4 10*3/uL — ABNORMAL HIGH (ref 3.6–11.0)

## 2016-11-03 LAB — BASIC METABOLIC PANEL
Anion gap: 6 (ref 5–15)
BUN: 18 mg/dL (ref 6–20)
CHLORIDE: 115 mmol/L — AB (ref 101–111)
CO2: 23 mmol/L (ref 22–32)
Calcium: 8.2 mg/dL — ABNORMAL LOW (ref 8.9–10.3)
Creatinine, Ser: 1.23 mg/dL — ABNORMAL HIGH (ref 0.44–1.00)
GFR calc Af Amer: 51 mL/min — ABNORMAL LOW (ref >60)
GFR calc non Af Amer: 44 mL/min — ABNORMAL LOW (ref >60)
GLUCOSE: 169 mg/dL — AB (ref 65–99)
POTASSIUM: 4.2 mmol/L (ref 3.5–5.1)
Sodium: 144 mmol/L (ref 135–145)

## 2016-11-03 LAB — PHOSPHORUS: Phosphorus: 4.3 mg/dL (ref 2.5–4.6)

## 2016-11-03 LAB — GLUCOSE, CAPILLARY
GLUCOSE-CAPILLARY: 140 mg/dL — AB (ref 65–99)
GLUCOSE-CAPILLARY: 144 mg/dL — AB (ref 65–99)
GLUCOSE-CAPILLARY: 193 mg/dL — AB (ref 65–99)
Glucose-Capillary: 138 mg/dL — ABNORMAL HIGH (ref 65–99)
Glucose-Capillary: 152 mg/dL — ABNORMAL HIGH (ref 65–99)
Glucose-Capillary: 174 mg/dL — ABNORMAL HIGH (ref 65–99)

## 2016-11-03 LAB — PROTIME-INR
INR: 2.09
PROTHROMBIN TIME: 23.8 s — AB (ref 11.4–15.2)

## 2016-11-03 LAB — MRSA PCR SCREENING: MRSA by PCR: NEGATIVE

## 2016-11-03 LAB — AMMONIA: Ammonia: 61 umol/L — ABNORMAL HIGH (ref 9–35)

## 2016-11-03 LAB — TROPONIN I: TROPONIN I: 0.85 ng/mL — AB (ref <0.03)

## 2016-11-03 LAB — LACTIC ACID, PLASMA
LACTIC ACID, VENOUS: 3.6 mmol/L — AB (ref 0.5–1.9)
Lactic Acid, Venous: 3.1 mmol/L (ref 0.5–1.9)
Lactic Acid, Venous: 2.2 mmol/L (ref 0.5–1.9)

## 2016-11-03 LAB — APTT: APTT: 43 s — AB (ref 24–36)

## 2016-11-03 LAB — PROCALCITONIN: Procalcitonin: 0.12 ng/mL

## 2016-11-03 LAB — HEPARIN LEVEL (UNFRACTIONATED): Heparin Unfractionated: 0.48 IU/mL (ref 0.30–0.70)

## 2016-11-03 MED ORDER — ASPIRIN EC 81 MG PO TBEC: 81.0000 mg | DELAYED_RELEASE_TABLET | Freq: Every day | ORAL | Status: DC

## 2016-11-03 MED ORDER — SODIUM CHLORIDE 0.9 % IV SOLN
INTRAVENOUS | Status: AC
  Administered 2016-11-03 (×2): via INTRAVENOUS

## 2016-11-03 MED ORDER — IPRATROPIUM-ALBUTEROL 0.5-2.5 (3) MG/3ML IN SOLN: 3.0000 mL | RESPIRATORY_TRACT | Status: DC

## 2016-11-03 MED ORDER — FENTANYL CITRATE (PF) 100 MCG/2ML IJ SOLN
INTRAMUSCULAR | Status: AC
  Administered 2016-11-03: 100 ug via INTRAVENOUS
  Filled 2016-11-03: qty 4

## 2016-11-03 MED ORDER — NOREPINEPHRINE BITARTRATE 1 MG/ML IV SOLN
0.0000 ug/min | INTRAVENOUS | Status: DC
  Administered 2016-11-04: 10 ug/min via INTRAVENOUS
  Filled 2016-11-03: qty 16

## 2016-11-03 MED ORDER — IPRATROPIUM-ALBUTEROL 0.5-2.5 (3) MG/3ML IN SOLN
3.0000 mL | RESPIRATORY_TRACT | Status: DC | PRN
  Administered 2016-11-03: 3 mL via RESPIRATORY_TRACT
  Filled 2016-11-03: qty 3

## 2016-11-03 MED ORDER — MAGNESIUM SULFATE 4 GM/100ML IV SOLN
4.0000 g | INTRAVENOUS | Status: AC
  Administered 2016-11-03: 4 g via INTRAVENOUS
  Filled 2016-11-03: qty 100

## 2016-11-03 MED ORDER — FREE WATER
200.0000 mL | Freq: Three times a day (TID) | Status: DC
Start: 2016-11-03 — End: 2016-11-05
  Administered 2016-11-03 – 2016-11-04 (×3): 200 mL

## 2016-11-03 MED ORDER — MIDAZOLAM HCL 2 MG/2ML IJ SOLN
INTRAMUSCULAR | Status: AC
  Administered 2016-11-03: 2 mg via INTRAVENOUS
  Filled 2016-11-03: qty 4

## 2016-11-03 MED ORDER — ENOXAPARIN SODIUM 40 MG/0.4ML ~~LOC~~ SOLN: 40.0000 mg | Freq: Every day | SUBCUTANEOUS | Status: DC

## 2016-11-03 MED ORDER — INSULIN ASPART 100 UNIT/ML ~~LOC~~ SOLN
0.0000 [IU] | SUBCUTANEOUS | Status: DC
  Administered 2016-11-03: 1 [IU] via SUBCUTANEOUS
  Administered 2016-11-03 (×2): 2 [IU] via SUBCUTANEOUS
  Administered 2016-11-03: 1 [IU] via SUBCUTANEOUS
  Administered 2016-11-04: 5 [IU] via SUBCUTANEOUS
  Administered 2016-11-04 (×3): 3 [IU] via SUBCUTANEOUS
  Filled 2016-11-03: qty 1
  Filled 2016-11-03: qty 2
  Filled 2016-11-03 (×2): qty 3
  Filled 2016-11-03: qty 5
  Filled 2016-11-03: qty 2
  Filled 2016-11-03: qty 1
  Filled 2016-11-03: qty 3

## 2016-11-03 MED ORDER — PANTOPRAZOLE SODIUM 40 MG IV SOLR
40.0000 mg | INTRAVENOUS | Status: DC
  Administered 2016-11-03 – 2016-11-04 (×2): 40 mg via INTRAVENOUS
  Filled 2016-11-03 (×2): qty 40

## 2016-11-03 MED ORDER — ROCURONIUM BROMIDE 50 MG/5ML IV SOLN
INTRAVENOUS | Status: AC
  Administered 2016-11-03: 40 mg via INTRAVENOUS
  Filled 2016-11-03: qty 1

## 2016-11-03 MED ORDER — LACTULOSE 10 GM/15ML PO SOLN
30.0000 g | Freq: Two times a day (BID) | ORAL | Status: DC
  Administered 2016-11-03 – 2016-11-04 (×3): 30 g
  Filled 2016-11-03 (×3): qty 60

## 2016-11-03 MED ORDER — FENTANYL CITRATE (PF) 100 MCG/2ML IJ SOLN
100.0000 ug | Freq: Once | INTRAMUSCULAR | Status: AC
  Administered 2016-11-03: 100 ug via INTRAVENOUS

## 2016-11-03 MED ORDER — MIDAZOLAM HCL 2 MG/2ML IJ SOLN
2.0000 mg | Freq: Once | INTRAMUSCULAR | Status: AC
  Administered 2016-11-03: 2 mg via INTRAVENOUS

## 2016-11-03 MED ORDER — SODIUM CHLORIDE 0.9 % IV BOLUS (SEPSIS)
500.0000 mL | Freq: Once | INTRAVENOUS | Status: AC
  Administered 2016-11-03: 500 mL via INTRAVENOUS

## 2016-11-03 MED ORDER — VITAL HIGH PROTEIN PO LIQD: 1000.0000 mL | ORAL | Status: DC

## 2016-11-03 MED ORDER — ACETAMINOPHEN 325 MG PO TABS: 650.0000 mg | ORAL_TABLET | Freq: Four times a day (QID) | ORAL | Status: DC | PRN

## 2016-11-03 MED ORDER — METOPROLOL TARTRATE 5 MG/5ML IV SOLN
2.5000 mg | Freq: Four times a day (QID) | INTRAVENOUS | Status: DC | PRN
  Administered 2016-11-04 (×2): 2.5 mg via INTRAVENOUS
  Filled 2016-11-03 (×2): qty 5

## 2016-11-03 MED ORDER — QUINAPRIL HCL 10 MG PO TABS
20.0000 mg | ORAL_TABLET | ORAL | Status: DC
  Filled 2016-11-03: qty 2

## 2016-11-03 MED ORDER — IPRATROPIUM-ALBUTEROL 0.5-2.5 (3) MG/3ML IN SOLN
3.0000 mL | RESPIRATORY_TRACT | Status: DC
  Administered 2016-11-03 – 2016-11-04 (×6): 3 mL via RESPIRATORY_TRACT
  Filled 2016-11-03 (×5): qty 3

## 2016-11-03 MED ORDER — ONDANSETRON HCL 4 MG PO TABS: 4.0000 mg | ORAL_TABLET | Freq: Four times a day (QID) | ORAL | Status: DC | PRN

## 2016-11-03 MED ORDER — CLOPIDOGREL BISULFATE 75 MG PO TABS
75.0000 mg | ORAL_TABLET | Freq: Every day | ORAL | Status: DC
  Administered 2016-11-03 – 2016-11-04 (×2): 75 mg via ORAL
  Filled 2016-11-03 (×2): qty 1

## 2016-11-03 MED ORDER — VANCOMYCIN HCL IN DEXTROSE 750-5 MG/150ML-% IV SOLN
750.0000 mg | Freq: Two times a day (BID) | INTRAVENOUS | Status: DC
  Administered 2016-11-03: 750 mg via INTRAVENOUS
  Filled 2016-11-03 (×3): qty 150

## 2016-11-03 MED ORDER — ONDANSETRON HCL 4 MG/2ML IJ SOLN: 4.0000 mg | Freq: Four times a day (QID) | INTRAMUSCULAR | Status: DC | PRN

## 2016-11-03 MED ORDER — SOTALOL HCL 80 MG PO TABS
80.0000 mg | ORAL_TABLET | Freq: Two times a day (BID) | ORAL | Status: DC
  Filled 2016-11-03 (×2): qty 1

## 2016-11-03 MED ORDER — MORPHINE SULFATE (PF) 4 MG/ML IV SOLN
INTRAVENOUS | Status: AC
  Administered 2016-11-03: 1 mg via INTRAVENOUS
  Filled 2016-11-03: qty 1

## 2016-11-03 MED ORDER — SODIUM CHLORIDE 0.9% FLUSH
3.0000 mL | Freq: Two times a day (BID) | INTRAVENOUS | Status: DC
  Administered 2016-11-03 – 2016-11-04 (×3): 3 mL via INTRAVENOUS

## 2016-11-03 MED ORDER — ASPIRIN 81 MG PO CHEW
81.0000 mg | CHEWABLE_TABLET | Freq: Every day | ORAL | Status: DC
  Administered 2016-11-03 – 2016-11-04 (×2): 81 mg
  Filled 2016-11-03 (×2): qty 1

## 2016-11-03 MED ORDER — MORPHINE SULFATE (PF) 4 MG/ML IV SOLN
1.0000 mg | Freq: Once | INTRAVENOUS | Status: AC
  Administered 2016-11-03: 1 mg via INTRAVENOUS

## 2016-11-03 MED ORDER — SODIUM CHLORIDE 0.9 % IV BOLUS (SEPSIS)
250.0000 mL | Freq: Once | INTRAVENOUS | Status: AC
  Administered 2016-11-03: 250 mL via INTRAVENOUS

## 2016-11-03 MED ORDER — DILTIAZEM HCL 30 MG PO TABS: 30.0000 mg | ORAL_TABLET | Freq: Four times a day (QID) | ORAL | Status: DC

## 2016-11-03 MED ORDER — MIDAZOLAM HCL 2 MG/2ML IJ SOLN
1.0000 mg | INTRAMUSCULAR | Status: DC | PRN
  Administered 2016-11-03 – 2016-11-04 (×2): 1 mg via INTRAVENOUS
  Filled 2016-11-03 (×2): qty 2

## 2016-11-03 MED ORDER — VITAL 1.5 CAL PO LIQD
1000.0000 mL | ORAL | Status: DC
  Administered 2016-11-03: 1000 mL

## 2016-11-03 MED ORDER — FENTANYL CITRATE (PF) 100 MCG/2ML IJ SOLN: 50.0000 ug | Freq: Once | INTRAMUSCULAR | Status: DC

## 2016-11-03 MED ORDER — ROCURONIUM BROMIDE 50 MG/5ML IV SOLN
40.0000 mg | Freq: Once | INTRAVENOUS | Status: AC
  Administered 2016-11-03: 40 mg via INTRAVENOUS

## 2016-11-03 MED ORDER — PIPERACILLIN-TAZOBACTAM 3.375 G IVPB
3.3750 g | Freq: Three times a day (TID) | INTRAVENOUS | Status: DC
  Administered 2016-11-03 – 2016-11-04 (×6): 3.375 g via INTRAVENOUS
  Filled 2016-11-03 (×6): qty 50

## 2016-11-03 MED ORDER — ACETAMINOPHEN 650 MG RE SUPP: 650.0000 mg | Freq: Four times a day (QID) | RECTAL | Status: DC | PRN

## 2016-11-03 MED ORDER — FENTANYL BOLUS VIA INFUSION
50.0000 ug | INTRAVENOUS | Status: DC | PRN
  Administered 2016-11-03 (×2): 50 ug via INTRAVENOUS
  Filled 2016-11-03: qty 50

## 2016-11-03 MED ORDER — MORPHINE SULFATE (PF) 4 MG/ML IV SOLN
4.0000 mg | INTRAVENOUS | Status: DC | PRN
  Administered 2016-11-03 – 2016-11-04 (×2): 4 mg via INTRAVENOUS
  Filled 2016-11-03 (×2): qty 1

## 2016-11-03 MED ORDER — MIDAZOLAM HCL 2 MG/2ML IJ SOLN: 1.0000 mg | INTRAMUSCULAR | Status: DC | PRN

## 2016-11-03 MED ORDER — HEPARIN BOLUS VIA INFUSION
4000.0000 [IU] | Freq: Once | INTRAVENOUS | Status: AC
  Administered 2016-11-03: 4000 [IU] via INTRAVENOUS
  Filled 2016-11-03: qty 4000

## 2016-11-03 MED ORDER — SENNOSIDES 8.8 MG/5ML PO SYRP: 5.0000 mL | ORAL_SOLUTION | Freq: Two times a day (BID) | ORAL | Status: DC | PRN

## 2016-11-03 MED ORDER — SODIUM CHLORIDE 0.9 % IV SOLN
25.0000 ug/h | INTRAVENOUS | Status: DC
  Administered 2016-11-03: 25 ug/h via INTRAVENOUS
  Administered 2016-11-04: 150 ug/h via INTRAVENOUS
  Filled 2016-11-03 (×2): qty 50

## 2016-11-03 MED ORDER — VANCOMYCIN HCL IN DEXTROSE 1-5 GM/200ML-% IV SOLN
1000.0000 mg | Freq: Once | INTRAVENOUS | Status: DC
  Filled 2016-11-03: qty 200

## 2016-11-03 MED ORDER — NOREPINEPHRINE BITARTRATE 1 MG/ML IV SOLN
0.0000 ug/min | INTRAVENOUS | Status: DC
  Administered 2016-11-03: 4 ug/min via INTRAVENOUS
  Filled 2016-11-03 (×2): qty 4

## 2016-11-03 MED ORDER — BUDESONIDE 0.5 MG/2ML IN SUSP
0.5000 mg | Freq: Two times a day (BID) | RESPIRATORY_TRACT | Status: DC
  Administered 2016-11-03 – 2016-11-04 (×2): 0.5 mg via RESPIRATORY_TRACT
  Filled 2016-11-03 (×2): qty 2

## 2016-11-03 MED ORDER — HEPARIN (PORCINE) IN NACL 100-0.45 UNIT/ML-% IJ SOLN
900.0000 [IU]/h | INTRAMUSCULAR | Status: DC
  Administered 2016-11-03: 900 [IU]/h via INTRAVENOUS
  Filled 2016-11-03: qty 250

## 2016-11-03 NOTE — Progress Notes (Signed)
Patient keeps reaching towards ET tube with hand.  Fentanyl drip increased.  Blood pressure stable on 15 mgc/min of levophed.  Daughter left prior to intubation and has not returned since.  Niece has visited.

## 2016-11-03 NOTE — Progress Notes (Signed)
Patient intubated while this RN was on lunch break.  Hardie Pulley, RN assisted MD and NP with intubation.  Hardie Pulley, RN and Pam, RN(charge nurses) assisting NP with central line placement at this time.

## 2016-11-03 NOTE — Progress Notes (Signed)
Discussed with [facility] transfer center again.  Discussed with Dr. Jack Quarto off [facility] ICU. Patient has been accepted for transfer. But there are no ICU beds available and she is on the waiting list. Agreed with treatment plan being done here.

## 2016-11-03 NOTE — Progress Notes (Signed)
RN spoke with Dr. Ashby Dawes during rounds and discussed patient's mental status along with low blood pressure.  Patient very drowsy but arouses to voice but does not follow commands.  Patient states "oww" to painful stimuli."  Moves all extremities.  Blood pressure low with MAP in mid 50's-60 on cardizem drip for heart rate control.  MD gave order to discontinue cardizem drip and to see if blood pressure comes up after stopping cardizem drip and to place NG tube and give lactulose via NG tube.

## 2016-11-03 NOTE — Progress Notes (Signed)
Patient daughter Yvonne Mcdaniel present and RN discussed plan of care with her and the need to designate one family member to be the main person updated by the doctors and nurses and why this is important.  Yvonne Mcdaniel verbalized understanding and stated her and her sibling would discuss who the designated person should be.  This RN spoke with Yvonne Mcdaniel(daughter) this morning and updated her on plan of care.  [name], NP has spoke with Yvonne Mcdaniel in person and updated her.

## 2016-11-03 NOTE — Procedures (Signed)
Central Venous Catheter Insertion Procedure Note BAYLIE STARLIPER [id number] Mar 23, 1946  Procedure: Insertion of Central Venous Catheter Indications: Assessment of intravascular volume, Drug and/or fluid administration and Frequent blood sampling  Procedure Details Consent: Risks of procedure as well as the alternatives and risks of each were explained to the (patient/caregiver).  Consent for procedure obtained. Time Out: Verified patient identification, verified procedure, site/side was marked, verified correct patient position, special equipment/implants available, medications/allergies/relevent history reviewed, required imaging and test results available.  Performed  Maximum sterile technique was used including antiseptics, cap, gloves, gown, hand hygiene, mask and sheet. Skin prep: Chlorhexidine; local anesthetic administered A antimicrobial bonded/coated triple lumen catheter was placed in the left internal jugular vein using the Seldinger technique.  Evaluation Blood flow good Complications: No apparent complications Patient did tolerate procedure well. Chest X-ray ordered to verify placement.  CXR: pending.  Placed left internal jugular central line utilizing ultrasound no complications noted during procedure.  Marda Stalker, [place] Pager [phone] (please enter 7 digits) PCCM Consult Pager [phone] (please enter 7 digits)  I was present for duration of the procedure.  -Marda Stalker, M.D.

## 2016-11-03 NOTE — Progress Notes (Signed)
PHARMACY - PHYSICIAN COMMUNICATION CRITICAL VALUE ALERT - BLOOD CULTURE IDENTIFICATION (BCID)  Results for orders placed or performed during the hospital encounter of 11/02/16  Blood Culture ID Panel (Reflexed) (Collected: 11/02/2016  3:12 PM)  Result Value Ref Range   Enterococcus species NOT DETECTED NOT DETECTED   Listeria monocytogenes NOT DETECTED NOT DETECTED   Staphylococcus species DETECTED (A) NOT DETECTED   Staphylococcus aureus NOT DETECTED NOT DETECTED   Methicillin resistance NOT DETECTED NOT DETECTED   Streptococcus species NOT DETECTED NOT DETECTED   Streptococcus agalactiae NOT DETECTED NOT DETECTED   Streptococcus pneumoniae NOT DETECTED NOT DETECTED   Streptococcus pyogenes NOT DETECTED NOT DETECTED   Acinetobacter baumannii NOT DETECTED NOT DETECTED   Enterobacteriaceae species NOT DETECTED NOT DETECTED   Enterobacter cloacae complex NOT DETECTED NOT DETECTED   Escherichia coli NOT DETECTED NOT DETECTED   Klebsiella oxytoca NOT DETECTED NOT DETECTED   Klebsiella pneumoniae NOT DETECTED NOT DETECTED   Proteus species NOT DETECTED NOT DETECTED   Serratia marcescens NOT DETECTED NOT DETECTED   Haemophilus influenzae NOT DETECTED NOT DETECTED   Neisseria meningitidis NOT DETECTED NOT DETECTED   Pseudomonas aeruginosa NOT DETECTED NOT DETECTED   Candida albicans NOT DETECTED NOT DETECTED   Candida glabrata NOT DETECTED NOT DETECTED   Candida krusei NOT DETECTED NOT DETECTED   Candida parapsilosis NOT DETECTED NOT DETECTED   Candida tropicalis NOT DETECTED NOT DETECTED    Name of physician (or Provider) Contacted: Hugelmeyer  Changes to prescribed antibiotics required:  No, will continue pt on Zosyn.   Mallery Harshman D 11/03/2016  10:22 PM

## 2016-11-03 NOTE — Progress Notes (Signed)
[facility] NAME: Yvonne Mcdaniel    MR#:  [id number]  DATE OF BIRTH:  May 02, 1946  SUBJECTIVE:  CHIEF COMPLAINT:   Chief Complaint  Patient presents with  . Altered Mental Status   Admitted for hepatic encephalopathy and sepsis with pneumonia Now on BiPAP.  On Cardizem and heparin drips.  REVIEW OF SYSTEMS:    Review of Systems  Unable to perform ROS: Mental status change    DRUG ALLERGIES:  No Known Allergies  VITALS:  Blood pressure (!) 99/36, pulse 90, temperature 98.1 F (36.7 C), temperature source Axillary, resp. rate 16, height 5\' 3"  (1.6 m), weight 81.6 kg (180 lb), SpO2 100 %.  PHYSICAL EXAMINATION:   Physical Exam  GENERAL:  71 y.o.-year-old patient lying in the bed with no acute distress.  EYES: Pupils equal, round, reactive to light and accommodation. No scleral icterus. Extraocular muscles intact.  HEENT: Head atraumatic, normocephalic. Oropharynx and nasopharynx clear.  NECK:  Supple, no jugular venous distention. No thyroid enlargement, no tenderness.  LUNGS: Normal breath sounds bilaterally, no wheezing, rales, rhonchi. No use of accessory muscles of respiration.  CARDIOVASCULAR: S1, S2 normal. No murmurs, rubs, or gallops.  ABDOMEN: Soft, nontender, nondistended. Bowel sounds present. No organomegaly or mass.  EXTREMITIES: No cyanosis, clubbing or edema b/l.    NEUROLOGIC: Cranial nerves II through XII are intact. No focal Motor or sensory deficits b/l.   PSYCHIATRIC: The patient is alert and oriented x 3.  SKIN: No obvious rash, lesion, or ulcer.   LABORATORY PANEL:   CBC  Recent Labs Lab 11/03/16 0421  WBC 16.4*  HGB 10.4*  HCT 30.7*  PLT 109*   ------------------------------------------------------------------------------------------------------------------ Chemistries   Recent Labs Lab 11/02/16 1444 11/03/16 0421  NA 142 144  K 3.7 4.2  CL 110 115*  CO2 19* 23  GLUCOSE 134* 169*   BUN 15 18  CREATININE 0.95 1.23*  CALCIUM 9.0 8.2*  MG  --  1.4*  AST 110*  --   ALT 61*  --   ALKPHOS 174*  --   BILITOT 4.0*  --   ------------------------------------------------------------------------------------------------------------------ Cardiac Enzymes  Recent Labs Lab 11/03/16 0336  TROPONINI 0.85*  ------------------------------------------------------------------------------------------------------------------ RADIOLOGY:  Ct Abdomen Pelvis Wo Contrast  Result Date: 11/02/2016 CLINICAL DATA:  Abdominal pain.  Recent liver stent placement EXAM: CT ABDOMEN AND PELVIS WITHOUT CONTRAST TECHNIQUE: Multidetector CT imaging of the abdomen and pelvis was performed following the standard protocol without IV contrast. COMPARISON:  08/11/2015 FINDINGS: Lower chest: Dense calcifications in the visualized right coronary artery. Lung bases clear. No effusions. Hepatobiliary: Severely shrunken, nodular liver compatible with cirrhosis. Large dilated recannulized umbilical vein compatible with portal venous hypertension. Prior cholecystectomy. Pancreas: No focal abnormality or ductal dilatation. Spleen: Spleen is normal caliber. Small low-density lesion within the spleen is stable. Adrenals/Urinary Tract: No visible renal mass, stones or hydronephrosis. Adrenal glands unremarkable. Urinary bladder is obscured from beam hardening artifact from the left hip replacement. Stomach/Bowel: Normal appendix. Stomach, large and small bowel grossly unremarkable. Vascular/Lymphatic: Diffuse aortic and iliac calcifications. No aneurysm. No adenopathy. Reproductive: Uterus and adnexa unremarkable.  No mass. Other: No free fluid or free air. Musculoskeletal: Prior left hip replacement. Degenerative disc and facet disease throughout the lumbar spine. No acute bony abnormality. IMPRESSION: Severely nodular, shrunken liver compatible with cirrhosis. Large dilated recannulized umbilical vein compatible with portal  venous hypertension. No acute findings in the abdomen or pelvis. Coronary artery disease, aortic atherosclerosis. Electronically Signed  By: Rolm Baptise M.D.   On: 11/02/2016 16:59   Ct Head Wo Contrast  Result Date: 11/02/2016 CLINICAL DATA:  Altered mental status.  Fever. EXAM: CT HEAD WITHOUT CONTRAST TECHNIQUE: Contiguous axial images were obtained from the base of the skull through the vertex without intravenous contrast. COMPARISON:  August 11, 2015 FINDINGS: Brain: There is age related volume loss, stable. There is mild invagination of CSF into the sella. There is no intracranial mass, hemorrhage, extra-axial fluid collection, or midline shift. There is slight small vessel disease in the centra semiovale bilaterally. Gray-white compartments elsewhere appear normal. No acute infarct evident. Vascular: There is no hyperdense vessel. There is calcification in the carotid siphon regions bilaterally. There is also calcification in the distal vertebral artery on the left. Skull: The bony calvarium appears intact. Sinuses/Orbits: There is mucosal thickening in several ethmoid air cells bilaterally. There is also mild mucosal thickening in the right maxillary antrum. Other visualized paranasal sinuses are clear. Visualized orbits appear symmetric bilaterally. Other: Visualized mastoid air cells are clear. IMPRESSION: Mild periventricular small vessel disease. No intracranial mass, hemorrhage, or extra-axial fluid collection. No acute infarct evident. There is a mild degree of empty sella. There are foci of arterial vascular calcification. There is mild paranasal sinus disease. Electronically Signed   By: Lowella Grip III M.D.   On: 11/02/2016 15:00   Dg Chest Port 1 View  Result Date: 11/03/2016 CLINICAL DATA:  Acute respiratory failure. EXAM: PORTABLE CHEST 1 VIEW COMPARISON:  One-view chest x-ray 11/02/2016 FINDINGS: The heart is enlarged. The side port of an NG tube is in the fundus of the stomach.  Slight increase in interstitial prominence suggests mild edema. Left basilar airspace disease is present. Atherosclerotic calcifications are present at the aortic arch. Degenerative changes are noted in the shoulders. IMPRESSION: 1. Cardiomegaly and increasing interstitial prominence suggesting edema and congestive heart failure. 2. Left lower lobe airspace disease likely reflects atelectasis. Infection is not excluded. 3. The side port of the NG tube is in the fundus of the stomach. Electronically Signed   By: San Morelle M.D.   On: 11/03/2016 12:39   Dg Chest Port 1 View  Result Date: 11/02/2016 CLINICAL DATA:  Altered mental status today.  History of cirrhosis. EXAM: PORTABLE CHEST 1 VIEW COMPARISON:  08/10/2015 FINDINGS: The heart is enlarged. There is aortic atherosclerosis. Markings at the lung bases are slightly prominent. This could be due to a poor inspiration or mild basilar pneumonia. No dense consolidation or lobar collapse. Upper lungs are clear. IMPRESSION: Poor inspiration versus mild basilar pneumonia. Electronically Signed   By: Nelson Chimes M.D.   On: 11/02/2016 15:04   Dg Abd Portable 1v  Result Date: 11/03/2016 CLINICAL DATA:  NG tube placement. EXAM: PORTABLE ABDOMEN - 1 VIEW COMPARISON:  One-view chest x-ray 11/02/2016. CT of the abdomen and pelvis 11/02/2016. FINDINGS: The side port of the NG tube is in the fundus of the stomach. There is mild gaseous distention of small bowel without obstruction. Minimal bibasilar airspace disease is stable. IMPRESSION: 1. The side port of the NG tube is in the fundus of the stomach. 2. Mild gaseous distension small bowel without obstruction. Electronically Signed   By: San Morelle M.D.   On: 11/03/2016 12:38   ASSESSMENT AND PLAN:   71 year old female with past medical history of hepatitis C, SVT, diabetes, hypertension, history of recent cardiac stent placement with coronary artery disease who presented to the hospital due to  altered mental status.  1. Severe sepsis due to bibasilar pneumonia with acute hypoxic and hypercapnic respiratory failure - IV antibiotics. Fluid resuscitation. - Discussed with the critical care team who have seen the patient. - We will need pressors if blood pressure does not improve. - On BiPAP.  2. SVT-patient has a previous history of SVT and currently rates are elevated due to underlying sepsis/encephalopathy. -Patient given some adenosine without much improvement in heart rate.  - Cardizem drip - Resume sotalol and more awake. - Cardiology consultation.  3. History of coronary artery disease status post recent stent placement-patient's troponins mildly elevated secondary to tachycardia and sepsis.  -we will cycle cardiac markers, continue aspirin, Plavix. Stop heparin drip. Not NSTEMI. On ASA, plavix. Discussed with Dr. Humphrey Rolls.  4. Diabetes type 2   5.  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 Management plans discussed with the patient, family and they are in agreement.  CODE STATUS: Full Code  Skull with patient's daughter who has requested transfer to [facility]. Call [facility] center. No ICU beds. Waiting to hear back if any beds open up.  And is unstable on BiPAP. Advised family that patient is unstable for transfer at this time. But in spite of this they want patient transferred.  will discuss with the ICU team at [facility].  TOTAL CC TIME TAKING CARE OF THIS PATIENT: 50 minutes.   Hillary Bow R M.D on 11/03/2016 at 1:01 PM  Between 7am to 6pm - Pager - [phone]  After 6pm go to [email or web address] - password [email or web address]  Office  [phone]  CC: Primary care physician; No primary care provider on file.  Note: This dictation was prepared with Dragon dictation along with smaller phrase technology. Any transcriptional  errors that result from this process are unintentional.

## 2016-11-03 NOTE — Progress Notes (Signed)
Pt arrived on unit only yelling words "oh gosh".  Pt would not follow commands at all.  Pupils were equal and reactive.  Pt was sinus tach on the monitor.  At one time, when labs were being drawn, pt became so agitated that her heartrate jumped into the 180's.  Dr. Marcille Blanco was aware and ordered a one time dose of morphine 1mg .  Morphine was already given once at 4mg .  Pt's heartrate went  back into the 110's.  Pt remains on cardizem drip at 10mg /hr.  Heparin drip remains infusing as ordered.  Dr. Marcille Blanco assessed pt. for low bp and a fluid bolus was given.  As night progressed, pt became less verbal but would still open eyes to voice.  MD aware.  Bladder scan was done to assess for urine retention and a total of 233cc was scanned.

## 2016-11-03 NOTE — Progress Notes (Signed)
Initial Nutrition Assessment  DOCUMENTATION CODES:   Not applicable  INTERVENTION:  -Discussed nutrition poc with MD Ashby Dawes. MD agreeable to initiation of EN once NG tube placed. Recommend starting Vital 1.5 at rate 20 ml/hr with goal rate of 55 ml/hr providing 1980 kcals, 90 g protein and 1003 mL of free water. Recommend starting free water flushes of 200 mL q 8 hours. Continue to assess -Orders placed for new measured weight   NUTRITION DIAGNOSIS:   Inadequate oral intake related to acute illness as evidenced by NPO status.  GOAL:   Patient will meet greater than or equal to 90% of their needs   MONITOR:   TF tolerance, Labs, Weight trends, I & O's  REASON FOR ASSESSMENT:   Consult Enteral/tube feeding initiation and management  ASSESSMENT:   71 yo female admitted with severe sepsis and respiratory failure with bibasilar pneumonia. Pt with hepatic encephalopathy with hx of hepatitis C and liver cirrhosis. Pt with additional hx of CAD, DM, HTN  Pt remains lethargic, pt currently on BiPap NG tube to be placed today for medication administration  Labs: magnesium 1.4 (supplemented), ammonia 61, FSBS 130s-150s Meds: ss novolog, lactulose  Diet Order:  Diet NPO time specified  Skin:  Reviewed, no issues  Last BM:  no documented BM  Height:   Ht Readings from Last 1 Encounters:  11/02/16 5\' 3"  (1.6 m)    Weight:   Wt Readings from Last 1 Encounters:  11/02/16 180 lb (81.6 kg)    BMI:  Body mass index is 31.89 kg/m.  Estimated Nutritional Needs:   Kcal:  [id number] kcals  Protein:  90-105 g  Fluid:  >/= 1.8 L  EDUCATION NEEDS:   No education needs identified at this time  [place], [place], [place] [phone] Pager  [phone] Weekend/On-Call Pager

## 2016-11-03 NOTE — Progress Notes (Signed)
Lab called with lactic acid level of 3.6.  NP made aware and a fluid bolus of 250cc was given.

## 2016-11-03 NOTE — Progress Notes (Signed)
[facility] Progress Note Patient Name: Yvonne Mcdaniel DOB: September 07, 1946 MRN: [id number]   Date of Service  11/03/2016  HPI/Events of Note  On vent Plat borderline, avoid pepcid Add ppi  eICU Interventions       Intervention Category Intermediate Interventions: Best-practice therapies (e.g. DVT, beta blocker, etc.)  Raylene Miyamoto. 11/03/2016, 4:41 PM

## 2016-11-03 NOTE — Progress Notes (Signed)
Yvonne Mcdaniel is a 71 y.o. female  [id number]  Primary Cardiologist: Neoma Laming Reason for Consultation: SVT  HPI: This is a 71 year old white female with a history of cirrhosis of the liver and ascites with hepatitis C and multiple blood transfusion and hypertension presented to the hospital with sepsis and was found to be in SVT. She was on Cardizem drip until this morning and right now is in sinus rhythm with sinus tachycardia occasionally. She is off Cardizem drip.   Review of Systems: Patient is intubated and unresponsive   Past Medical History:  Diagnosis Date  . Cirrhosis of liver without ascites ([place])   . Hep C w/o coma, chronic ([place])   . History of blood transfusion    70's  . Hypertension     Medications Prior to Admission  Medication Sig Dispense Refill  . amLODipine (NORVASC) 5 MG tablet Take 1 tablet by mouth daily.    Marland Kitchen aspirin EC 81 MG tablet Take 81 mg by mouth daily.    . clopidogrel (PLAVIX) 75 MG tablet Take 75 mg by mouth daily.    . furosemide (LASIX) 20 MG tablet TAKE 1 TABLET (20 MG TOTAL) BY MOUTH EVERY MORNING.  11  . gabapentin (NEURONTIN) 300 MG capsule Take 300 mg by mouth 3 (three) times daily.    Marland Kitchen gentamicin (GARAMYCIN) 0.3 % ophthalmic ointment Place into both eyes 2 (two) times daily.    . hydrOXYzine (ATARAX/VISTARIL) 10 MG tablet Take 10 mg by mouth 3 (three) times daily as needed.    . insulin aspart (NOVOLOG) 100 UNIT/ML injection Inject 3 Units into the skin 3 (three) times daily with meals. (Patient taking differently: Inject 3-12 Units into the skin 3 (three) times daily with meals. 151-200=3 units 201-250=6units 251-300-9units 301-350=12units) 10 mL 11  . insulin glargine (LANTUS) 100 UNIT/ML injection Inject 0.15 mLs (15 Units total) into the skin at bedtime. 10 mL 11  . oxyCODONE (OXY IR/ROXICODONE) 5 MG immediate release tablet Take 5 mg by mouth every 4 (four) hours as needed for severe pain.    Marland Kitchen quinapril (ACCUPRIL) 20 MG tablet  Take 1 tablet by mouth every morning.    . sotalol (BETAPACE) 80 MG tablet Take 1 tablet (80 mg total) by mouth 2 (two) times daily. 60 tablet 1  . spironolactone (ALDACTONE) 50 MG tablet TAKE 1 TABLET (50 MG TOTAL) BY MOUTH DAILY.  11  . tiZANidine (ZANAFLEX) 2 MG tablet Take 2-4 mg by mouth every 8 (eight) hours as needed for muscle spasms.    Marland Kitchen lactulose (CHRONULAC) 10 GM/15ML solution Take 45 mLs (30 g total) by mouth 2 (two) times daily. 240 mL 0     . aspirin  81 mg Per Tube Daily  . clopidogrel  75 mg Oral Daily  . fentaNYL      . fentaNYL (SUBLIMAZE) injection  100 mcg Intravenous Once  . fentaNYL (SUBLIMAZE) injection  100 mcg Intravenous Once  . fentaNYL (SUBLIMAZE) injection  50 mcg Intravenous Once  . free water  200 mL Per Tube Q8H  . insulin aspart  0-9 Units Subcutaneous Q4H  . iopamidol  30 mL Oral Once  . lactulose  30 g Per Tube BID  . midazolam      . midazolam  2 mg Intravenous Once  . midazolam  2 mg Intravenous Once  . piperacillin-tazobactam (ZOSYN)  IV  3.375 g Intravenous Q8H  . rocuronium      . rocuronium  40 mg  Intravenous Once  . sodium chloride flush  3 mL Intravenous Q12H    Infusions: . feeding supplement (VITAL 1.5 CAL)    . fentaNYL infusion INTRAVENOUS 25 mcg/hr (11/03/16 1529)  . norepinephrine (LEVOPHED) Adult infusion      No Known Allergies  Social History   Social History  . Marital status: Single    Spouse name: N/A  . Number of children: N/A  . Years of education: N/A   Occupational History  . Not on file.   Social History Main Topics  . Smoking status: Former Research scientist (life sciences)  . Smokeless tobacco: Never Used  . Alcohol use No  . Drug use: No  . Sexual activity: Not Currently   Other Topics Concern  . Not on file   Social History Narrative  . No narrative on file    Family History  Problem Relation Age of Onset  . Cancer Father   . Hypertension Other   . Cancer Other   . Heart disease Brother   . Diabetes Maternal Aunt    . Breast cancer Cousin 35    PHYSICAL EXAM: Vitals:   11/03/16 1500 11/03/16 1530  BP: (!) 107/51 (!) 98/54  Pulse: 100 100  Resp: 16 14  Temp:       Intake/Output Summary (Last 24 hours) at 11/03/16 1538 Last data filed at 11/03/16 1305  Gross per 24 hour  Intake             1308 ml  Output                0 ml  Net             1308 ml    General:  Well appearing. No respiratory difficulty HEENT: normal Neck: supple. no JVD. Carotids 2+ bilat; no bruits. No lymphadenopathy or thryomegaly appreciated. Cor: PMI nondisplaced. Regular rate & rhythm. No rubs, gallops or murmurs. Lungs: clear Abdomen: soft, nontender, nondistended. No hepatosplenomegaly. No bruits or masses. Good bowel sounds. Extremities: no cyanosis, clubbing, rash, edema Neuro: alert & oriented x 3, cranial nerves grossly intact. moves all 4 extremities w/o difficulty. Affect pleasant.  ECG: No EKG in the chart but will order EKG on the monitor patient appears to be in sinus rhythm  Results for orders placed or performed during the hospital encounter of 11/02/16 (from the past 24 hour(s))  Lactic acid, plasma     Status: Abnormal   Collection Time: 11/02/16  6:10 PM  Result Value Ref Range   Lactic Acid, Venous 9.3 (HH) 0.5 - 1.9 mmol/L  Troponin I     Status: Abnormal   Collection Time: 11/02/16  6:10 PM  Result Value Ref Range   Troponin I 0.76 (HH) <0.03 ng/mL  Lactic acid, plasma     Status: Abnormal   Collection Time: 11/02/16  9:32 PM  Result Value Ref Range   Lactic Acid, Venous 6.8 (HH) 0.5 - 1.9 mmol/L  Glucose, capillary     Status: Abnormal   Collection Time: 11/02/16  9:34 PM  Result Value Ref Range   Glucose-Capillary 158 (H) 65 - 99 mg/dL  Troponin I     Status: Abnormal   Collection Time: 11/02/16 10:43 PM  Result Value Ref Range   Troponin I 0.91 (HH) <0.03 ng/mL  Glucose, capillary     Status: Abnormal   Collection Time: 11/03/16  1:04 AM  Result Value Ref Range    Glucose-Capillary 138 (H) 65 - 99 mg/dL  MRSA PCR Screening  Status: None   Collection Time: 11/03/16  1:09 AM  Result Value Ref Range   MRSA by PCR NEGATIVE NEGATIVE  Ammonia     Status: Abnormal   Collection Time: 11/03/16  3:36 AM  Result Value Ref Range   Ammonia 61 (H) 9 - 35 umol/L  Troponin I     Status: Abnormal   Collection Time: 11/03/16  3:36 AM  Result Value Ref Range   Troponin I 0.85 (HH) <0.03 ng/mL  APTT     Status: Abnormal   Collection Time: 11/03/16  3:36 AM  Result Value Ref Range   aPTT 43 (H) 24 - 36 seconds  Protime-INR     Status: Abnormal   Collection Time: 11/03/16  3:36 AM  Result Value Ref Range   Prothrombin Time 23.8 (H) 11.4 - 15.2 seconds   INR [id number]   Basic metabolic panel     Status: Abnormal   Collection Time: 11/03/16  4:21 AM  Result Value Ref Range   Sodium 144 135 - 145 mmol/L   Potassium 4.2 3.5 - 5.1 mmol/L   Chloride 115 (H) 101 - 111 mmol/L   CO2 23 22 - 32 mmol/L   Glucose, Bld 169 (H) 65 - 99 mg/dL   BUN 18 6 - 20 mg/dL   Creatinine, Ser 1.23 (H) 0.44 - 1.00 mg/dL   Calcium 8.2 (L) 8.9 - 10.3 mg/dL   GFR calc non Af Amer 44 (L) >60 mL/min   GFR calc Af Amer 51 (L) >60 mL/min   Anion gap 6 5 - 15  CBC     Status: Abnormal   Collection Time: 11/03/16  4:21 AM  Result Value Ref Range   WBC 16.4 (H) 3.6 - 11.0 K/uL   RBC 3.05 (L) 3.80 - 5.20 MIL/uL   Hemoglobin 10.4 (L) 12.0 - 16.0 g/dL   HCT 30.7 (L) 35.0 - 47.0 %   MCV 100.8 (H) 80.0 - 100.0 fL   MCH 34.0 26.0 - 34.0 pg   MCHC 33.8 32.0 - 36.0 g/dL   RDW 17.5 (H) 11.5 - 14.5 %   Platelets 109 (L) 150 - 440 K/uL  Lactic acid, plasma     Status: Abnormal   Collection Time: 11/03/16  4:21 AM  Result Value Ref Range   Lactic Acid, Venous 3.1 (HH) 0.5 - 1.9 mmol/L  Magnesium     Status: Abnormal   Collection Time: 11/03/16  4:21 AM  Result Value Ref Range   Magnesium 1.4 (L) 1.7 - 2.4 mg/dL  Procalcitonin - Baseline     Status: None   Collection Time: 11/03/16  4:21 AM   Result Value Ref Range   Procalcitonin 0.12 ng/mL  Phosphorus     Status: None   Collection Time: 11/03/16  4:21 AM  Result Value Ref Range   Phosphorus 4.3 2.5 - 4.6 mg/dL  Lactic acid, plasma     Status: Abnormal   Collection Time: 11/03/16  7:55 AM  Result Value Ref Range   Lactic Acid, Venous 2.2 (HH) 0.5 - 1.9 mmol/L  Heparin level (unfractionated)     Status: None   Collection Time: 11/03/16  7:55 AM  Result Value Ref Range   Heparin Unfractionated 0.48 0.30 - 0.70 IU/mL  Glucose, capillary     Status: Abnormal   Collection Time: 11/03/16  8:09 AM  Result Value Ref Range   Glucose-Capillary 152 (H) 65 - 99 mg/dL  Blood gas, arterial     Status: Abnormal  Collection Time: 11/03/16 10:12 AM  Result Value Ref Range   FIO2 0.40    Delivery systems NASAL CANNULA    pH, Arterial 7.19 (LL) 7.350 - 7.450   pCO2 arterial 64 (H) 32.0 - 48.0 mmHg   pO2, Arterial 68 (L) 83.0 - 108.0 mmHg   Bicarbonate 24.4 20.0 - 28.0 mmol/L   Acid-base deficit 4.3 (H) 0.0 - 2.0 mmol/L   O2 Saturation 88.1 %   Patient temperature 37.0    Collection site RIGHT RADIAL    Sample type ARTERIAL DRAW    Allens test (pass/fail) PASS PASS  Glucose, capillary     Status: Abnormal   Collection Time: 11/03/16 11:28 AM  Result Value Ref Range   Glucose-Capillary 140 (H) 65 - 99 mg/dL  Blood gas, arterial     Status: Abnormal (Preliminary result)   Collection Time: 11/03/16  1:00 PM  Result Value Ref Range   FIO2 0.40    Delivery systems BILEVEL POSITIVE AIRWAY PRESSURE    pH, Arterial 7.23 (L) 7.350 - 7.450   pCO2 arterial 48 32.0 - 48.0 mmHg   pO2, Arterial 43 (L) 83.0 - 108.0 mmHg   Bicarbonate 20.1 20.0 - 28.0 mmol/L   Acid-base deficit 7.3 (H) 0.0 - 2.0 mmol/L   O2 Saturation 68.1 %   Patient temperature 37.0    Collection site RIGHT RADIAL    Sample type ARTHROGRAPHIS SPECIES    Allens test (pass/fail) PASS PASS   Mechanical Rate PENDING   Blood gas, arterial     Status: Abnormal    Collection Time: 11/03/16  2:50 PM  Result Value Ref Range   FIO2 100.00    Delivery systems VENTILATOR    Mode PRESSURE REGULATED VOLUME CONTROL    VT 500 mL   LHR 16 resp/min   Peep/cpap 5.0 cm H20   pH, Arterial 7.31 (L) 7.350 - 7.450   pCO2 arterial 40 32.0 - 48.0 mmHg   pO2, Arterial 385 (H) 83.0 - 108.0 mmHg   Bicarbonate 20.1 20.0 - 28.0 mmol/L   Acid-base deficit 5.7 (H) 0.0 - 2.0 mmol/L   O2 Saturation 100.0 %   Patient temperature 37.0    Collection site RIGHT RADIAL    Sample type ARTERIAL DRAW    Allens test (pass/fail) POSITIVE (A) PASS   Ct Abdomen Pelvis Wo Contrast  Result Date: 11/02/2016 CLINICAL DATA:  Abdominal pain.  Recent liver stent placement EXAM: CT ABDOMEN AND PELVIS WITHOUT CONTRAST TECHNIQUE: Multidetector CT imaging of the abdomen and pelvis was performed following the standard protocol without IV contrast. COMPARISON:  08/11/2015 FINDINGS: Lower chest: Dense calcifications in the visualized right coronary artery. Lung bases clear. No effusions. Hepatobiliary: Severely shrunken, nodular liver compatible with cirrhosis. Large dilated recannulized umbilical vein compatible with portal venous hypertension. Prior cholecystectomy. Pancreas: No focal abnormality or ductal dilatation. Spleen: Spleen is normal caliber. Small low-density lesion within the spleen is stable. Adrenals/Urinary Tract: No visible renal mass, stones or hydronephrosis. Adrenal glands unremarkable. Urinary bladder is obscured from beam hardening artifact from the left hip replacement. Stomach/Bowel: Normal appendix. Stomach, large and small bowel grossly unremarkable. Vascular/Lymphatic: Diffuse aortic and iliac calcifications. No aneurysm. No adenopathy. Reproductive: Uterus and adnexa unremarkable.  No mass. Other: No free fluid or free air. Musculoskeletal: Prior left hip replacement. Degenerative disc and facet disease throughout the lumbar spine. No acute bony abnormality. IMPRESSION: Severely  nodular, shrunken liver compatible with cirrhosis. Large dilated recannulized umbilical vein compatible with portal venous hypertension. No acute findings  in the abdomen or pelvis. Coronary artery disease, aortic atherosclerosis. Electronically Signed   By: Rolm Baptise M.D.   On: 11/02/2016 16:59   Ct Head Wo Contrast  Result Date: 11/02/2016 CLINICAL DATA:  Altered mental status.  Fever. EXAM: CT HEAD WITHOUT CONTRAST TECHNIQUE: Contiguous axial images were obtained from the base of the skull through the vertex without intravenous contrast. COMPARISON:  August 11, 2015 FINDINGS: Brain: There is age related volume loss, stable. There is mild invagination of CSF into the sella. There is no intracranial mass, hemorrhage, extra-axial fluid collection, or midline shift. There is slight small vessel disease in the centra semiovale bilaterally. Gray-white compartments elsewhere appear normal. No acute infarct evident. Vascular: There is no hyperdense vessel. There is calcification in the carotid siphon regions bilaterally. There is also calcification in the distal vertebral artery on the left. Skull: The bony calvarium appears intact. Sinuses/Orbits: There is mucosal thickening in several ethmoid air cells bilaterally. There is also mild mucosal thickening in the right maxillary antrum. Other visualized paranasal sinuses are clear. Visualized orbits appear symmetric bilaterally. Other: Visualized mastoid air cells are clear. IMPRESSION: Mild periventricular small vessel disease. No intracranial mass, hemorrhage, or extra-axial fluid collection. No acute infarct evident. There is a mild degree of empty sella. There are foci of arterial vascular calcification. There is mild paranasal sinus disease. Electronically Signed   By: Lowella Grip III M.D.   On: 11/02/2016 15:00   Dg Chest Port 1 View  Result Date: 11/03/2016 CLINICAL DATA:  Intubation. EXAM: PORTABLE CHEST 1 VIEW COMPARISON:  01/03/ 2018. FINDINGS:  Interim placement endotracheal tube, its tip is 2.8 cm above the carina. Interim placement of left IJ line, its tip is projected superior vena cava. NG tube in stable position. Cardiomegaly with pulmonary venous congestion. Mild interstitial prominence noted. Findings consistent with mild CHF. Low lung volumes. Small left pleural effusion. No pneumothorax. Pacing pads are noted. No acute bony abnormality . IMPRESSION: 1. Interim placement endotracheal tube and left IJ line. Tips are in good anatomic position. NG tube in stable position. 2. Cardiomegaly with pulmonary venous congestion, bilateral pulmonary interstitial prominence, small left pleural effusion. Findings suggest mild congestive heart failure . Low lung volumes. Electronically Signed   By: Marcello Moores  Register   On: 11/03/2016 15:27   Dg Chest Port 1 View  Result Date: 11/03/2016 CLINICAL DATA:  Acute respiratory failure. EXAM: PORTABLE CHEST 1 VIEW COMPARISON:  One-view chest x-ray 11/02/2016 FINDINGS: The heart is enlarged. The side port of an NG tube is in the fundus of the stomach. Slight increase in interstitial prominence suggests mild edema. Left basilar airspace disease is present. Atherosclerotic calcifications are present at the aortic arch. Degenerative changes are noted in the shoulders. IMPRESSION: 1. Cardiomegaly and increasing interstitial prominence suggesting edema and congestive heart failure. 2. Left lower lobe airspace disease likely reflects atelectasis. Infection is not excluded. 3. The side port of the NG tube is in the fundus of the stomach. Electronically Signed   By: San Morelle M.D.   On: 11/03/2016 12:39   Dg Chest Port 1 View  Result Date: 11/02/2016 CLINICAL DATA:  Altered mental status today.  History of cirrhosis. EXAM: PORTABLE CHEST 1 VIEW COMPARISON:  08/10/2015 FINDINGS: The heart is enlarged. There is aortic atherosclerosis. Markings at the lung bases are slightly prominent. This could be due to a poor  inspiration or mild basilar pneumonia. No dense consolidation or lobar collapse. Upper lungs are clear. IMPRESSION: Poor inspiration versus mild basilar  pneumonia. Electronically Signed   By: Nelson Chimes M.D.   On: 11/02/2016 15:04   Dg Abd Portable 1v  Result Date: 11/03/2016 CLINICAL DATA:  NG tube placement. EXAM: PORTABLE ABDOMEN - 1 VIEW COMPARISON:  One-view chest x-ray 11/02/2016. CT of the abdomen and pelvis 11/02/2016. FINDINGS: The side port of the NG tube is in the fundus of the stomach. There is mild gaseous distention of small bowel without obstruction. Minimal bibasilar airspace disease is stable. IMPRESSION: 1. The side port of the NG tube is in the fundus of the stomach. 2. Mild gaseous distension small bowel without obstruction. Electronically Signed   By: San Morelle M.D.   On: 11/03/2016 12:38     ASSESSMENT AND PLAN: Status post supraventricular tachycardia and currently in sinus rhythm with respiratory failure/cirrhosis of the liver/sepsis. SVT female been related to underlying sepsis as well as pneumonia. Right now patient is hypotensive and if heart rate is over 100 mg consider digoxin and will get echocardiogram to evaluate ejection fraction and data EKG also.  Aarica Wax A

## 2016-11-03 NOTE — Procedures (Signed)
Endotracheal Intubation Procedure Note  Indication for endotracheal intubation: respiratory failure. Airway Assessment: Mallampati Class: II (hard and soft palate, upper portion of tonsils anduvula visible). Sedation: fentanyl and midazolam. Paralytic: rocuronium. Lidocaine: no. Atropine: no. Equipment: Macintosh 3 laryngoscope blade and 8.62mm cuffed endotracheal tube. Cricoid Pressure: no. Number of attempts: 1. ETT location confirmed by by auscultation and ETCO2 monitor.  Yvonne Mcdaniel, [place] Pager [phone] (please enter 7 digits) PCCM Consult Pager [phone] (please enter 7 digits)   I was present for the duration of the procedure.  Yvonne Mcdaniel, M.D 11/03/2016

## 2016-11-03 NOTE — Progress Notes (Signed)
[facility] Progress Note Patient Name: Yvonne Mcdaniel DOB: Dec 28, 1945 MRN: [id number]   Date of Service  11/03/2016  HPI/Events of Note  Staph species in BCID Add vanc empiric until we see ID  eICU Interventions       Intervention Category Major Interventions: Infection - evaluation and management  Raylene Miyamoto. 11/03/2016, 5:39 PM

## 2016-11-03 NOTE — Progress Notes (Signed)
[facility] Progress Note Patient Name: Yvonne Mcdaniel DOB: October 13, 1946 MRN: [id number]   Date of Service  11/03/2016  HPI/Events of Note  Transient SVT with rate to 180's. Now Sinus Tachycardia with rate = 112. BMP already ordered for AM.  eICU Interventions  Will order: 1. Mg++ level now.      Intervention Category Major Interventions: Electrolyte abnormality - evaluation and management  Cristino Degroff [place] 11/03/2016, 3:38 AM

## 2016-11-03 NOTE — Progress Notes (Signed)
ANTICOAGULATION CONSULT NOTE - Initial Consult  Pharmacy Consult for heparin Indication: chest pain/ACS  No Known Allergies  Patient Measurements: Height: 5\' 3"  (160 cm) Weight: 180 lb (81.6 kg) IBW/kg (Calculated) : 52.4 Heparin Dosing Weight: 70.3 kg  Vital Signs: Temp: 98.8 F (37.1 C) (01/03 0115) Temp Source: Oral (01/03 0115) BP: 126/50 (01/03 0015) Pulse Rate: 118 (01/03 0015)  Labs:  Recent Labs  11/02/16 1444 11/02/16 1810 11/02/16 2243  HGB 11.6*  --   --   HCT 34.5*  --   --   PLT 133*  --   --   CREATININE 0.95  --   --   CKTOTAL 91  --   --   TROPONINI 0.48* 0.76* 0.91*    Estimated Creatinine Clearance: 57.4 mL/min (by C-G formula based on SCr of 0.95 mg/dL).   Medical History: Past Medical History:  Diagnosis Date  . Cirrhosis of liver without ascites ([place])   . Hep C w/o coma, chronic ([place])   . History of blood transfusion    70's  . Hypertension     Medications:  Infusions:  . sodium chloride    . diltiazem (CARDIZEM) infusion 12.5 mg/hr (11/03/16 0157)  . heparin      Assessment: 70 yof cc AMS with positive, rising troponin. Pharmacy consulted to dose heparin for ACS. No AC listed on PTA and patient has not received prophylactic LMWH at this point. Will continue with bolus + infusion.   Goal of Therapy:  Heparin level 0.3-0.7 units/ml Monitor platelets by anticoagulation protocol: Yes   Plan:  Give 4000 units bolus x 1 Start heparin infusion at 900 units/hr Check anti-Xa level in 6 hours and daily while on heparin Continue to monitor H&H and platelets  Laural Benes, Pharm.D., BCPS Clinical Pharmacist 11/03/2016,1:58 AM

## 2016-11-03 NOTE — Progress Notes (Signed)
Pharmacy Antibiotic Note/ Electrolyte Monitoring  Yvonne Mcdaniel is a 71 y.o. female with a h/o cirrhosis admitted on 11/02/2016 with sepsis and SVT.  Pharmacy has been consulted for Zosyn dosing as well as electrolyte monitoring and replacement.   GPC (Staph species per BCID) in 1/2 BCx  Plan:  1. Zosyn 3.375g IV q8h (4 hour infusion).   After discussion with NP, staph likely represents contamination and methicillin resistance was not detected. Will continue with Zosyn only for now.   2. Magnesium sulfate 4 g iv once and will f/u AM labs.   Height: 5\' 3"  (160 cm) Weight: 180 lb (81.6 kg) IBW/kg (Calculated) : 52.4  Temp (24hrs), Avg:98 F (36.7 C), Min:97 F (36.1 C), Max:98.8 F (37.1 C)   Recent Labs Lab 11/02/16 1444 11/02/16 1810 11/02/16 2132 11/03/16 0421 11/03/16 0755  WBC 13.3*  --   --  16.4*  --   CREATININE 0.95  --   --  1.23*  --   LATICACIDVEN 9.0* 9.3* 6.8* 3.1* 2.2*    Estimated Creatinine Clearance: 44.3 mL/min (by C-G formula based on SCr of 1.23 mg/dL (H)).    No Known Allergies  Antimicrobials this admission: Vancomycin 1/2 >> 1/3 Zosyn 1/2 >>   Dose adjustments this admission:   Microbiology results: 1/2 BCx: GPC (Staph) 1/2 1/2 UCx: negative  1/3 MRSA PCR: negative  Thank you for allowing pharmacy to be a part of this patient's care.  Napoleon Form 11/03/2016 3:57 PM

## 2016-11-03 NOTE — Consult Note (Signed)
PULMONARY / CRITICAL CARE MEDICINE   Name: Yvonne Mcdaniel MRN: [id number] DOB: 1946/01/05    ADMISSION DATE:  11/02/2016 CONSULTATION DATE:  11/03/2016  REFERRING MD:  Dr. Darvin Neighbours   CHIEF COMPLAINT:  Altered mental status  HISTORY OF PRESENT ILLNESS:   This is a 71 yo female with a PMH of HTN, Hepatitis C (diagnosed 2016), Hepatocellular Carcinoma (Currently on liver transplant list at [facility]), Cirrhosis of liver without ascites, Liver lesion, Peripheral neuropathy, Hepatic encephalopathy, Atrial flutter, CAD s/p stent placement, and Diabetes Mellitus.  She presented to [facility] ER 01/2 with altered mental status.  According to pts family they had been trying to call the pt over the past 2 days prior to presentation to the ER, however were unable to get in contact with her.  Therefore, a family member went to the pts home and found that the pt was confused and repeating the same thing over and over again, therefore she was transported to the ER for further evaluation.  Per family at baseline the pt is normally alert and oriented.  In the ER the pt was found to be tachycardic with an elevated lactic acid.  CXR revealed possible pneumonia and she was also thought to be septic.  She was subsequently admitted to the ICU.  PCCM consulted 01/3 for further management of hepatic encephalopathy and septic shock secondary to pneumonia.  PAST MEDICAL HISTORY :  She  has a past medical history of Cirrhosis of liver without ascites ([place]); Hep C w/o coma, chronic ([place]); History of blood transfusion; and Hypertension.  PAST SURGICAL HISTORY: She  has a past surgical history that includes Cesarean section; Cholecystectomy; Total hip arthroplasty; Neck surgery; and Tubal ligation.  No Known Allergies  No current facility-administered medications on file prior to encounter.    Current Outpatient Prescriptions on File Prior to Encounter  Medication Sig  . amLODipine (NORVASC) 5 MG tablet Take 1 tablet by mouth daily.  .  furosemide (LASIX) 20 MG tablet TAKE 1 TABLET (20 MG TOTAL) BY MOUTH EVERY MORNING.  Marland Kitchen gabapentin (NEURONTIN) 300 MG capsule Take 300 mg by mouth 3 (three) times daily.  . insulin aspart (NOVOLOG) 100 UNIT/ML injection Inject 3 Units into the skin 3 (three) times daily with meals. (Patient taking differently: Inject 3-12 Units into the skin 3 (three) times daily with meals. 151-200=3 units 201-250=6units 251-300-9units 301-350=12units)  . insulin glargine (LANTUS) 100 UNIT/ML injection Inject 0.15 mLs (15 Units total) into the skin at bedtime.  . quinapril (ACCUPRIL) 20 MG tablet Take 1 tablet by mouth every morning.  . sotalol (BETAPACE) 80 MG tablet Take 1 tablet (80 mg total) by mouth 2 (two) times daily.  Marland Kitchen spironolactone (ALDACTONE) 50 MG tablet TAKE 1 TABLET (50 MG TOTAL) BY MOUTH DAILY.  Marland Kitchen tiZANidine (ZANAFLEX) 2 MG tablet Take 2-4 mg by mouth every 8 (eight) hours as needed for muscle spasms.  Marland Kitchen lactulose (CHRONULAC) 10 GM/15ML solution Take 45 mLs (30 g total) by mouth 2 (two) times daily.    FAMILY HISTORY:  Her indicated that her mother is alive. She indicated that her father is deceased. She indicated that the status of her brother is unknown. She indicated that the status of her maternal aunt is unknown. She indicated that the status of her cousin is unknown. She indicated that the status of her other is unknown.    SOCIAL HISTORY: She  reports that she has quit smoking. She has never used smokeless tobacco. She reports that she does  not drink alcohol or use drugs.  REVIEW OF SYSTEMS:   Unable to assess pt confused and lethargic   SUBJECTIVE:  Unable to assess pt confused and lethargic   VITAL SIGNS: BP (!) 83/40   Pulse 94   Temp 98.1 F (36.7 C) (Axillary)   Resp (!) 7   Ht 5\' 3"  (1.6 m)   Wt 180 lb (81.6 kg)   SpO2 91%   BMI 31.89 kg/m   HEMODYNAMICS:    VENTILATOR SETTINGS:    INTAKE / OUTPUT: I/O last 3 completed shifts: In: 169 [I.V.:169] Out: -    PHYSICAL EXAMINATION: General:  Acutely ill obese Caucasian female, well developed, well nourished  Neuro:  Lethargic, not following commands, opens eyes to voice, PERRL HEENT:  Supple, no JVD Cardiovascular:  Sinus tach, s1s2, no M/R/G Lungs:  Rhonchi throughout, mildly labored and tachypneic on 4L nasal canula Abdomen:  Faint BS x4, soft, tender, non distended Musculoskeletal:  Trace edema bilateral lower extremities, moves all extremities Skin: scattered skin tears and ecchymosis   LABS:  BMET  Recent Labs Lab 11/02/16 1444 11/03/16 0421  NA 142 144  K 3.7 4.2  CL 110 115*  CO2 19* 23  BUN 15 18  CREATININE 0.95 1.23*  GLUCOSE 134* 169*    Electrolytes  Recent Labs Lab 11/02/16 1444 11/03/16 0421  CALCIUM 9.0 8.2*  MG  --  1.4*    CBC  Recent Labs Lab 11/02/16 1444 11/03/16 0421  WBC 13.3* 16.4*  HGB 11.6* 10.4*  HCT 34.5* 30.7*  PLT 133* 109*    Coag's  Recent Labs Lab 11/03/16 0336  APTT 43*  INR 2.09    Sepsis Markers  Recent Labs Lab 11/02/16 2132 11/03/16 0421 11/03/16 0755  LATICACIDVEN 6.8* 3.1* 2.2*    ABG No results for input(s): PHART, PCO2ART, PO2ART in the last 168 hours.  Liver Enzymes  Recent Labs Lab 11/02/16 1444  AST 110*  ALT 61*  ALKPHOS 174*  BILITOT 4.0*  ALBUMIN 2.8*    Cardiac Enzymes  Recent Labs Lab 11/02/16 1810 11/02/16 2243 11/03/16 0336  TROPONINI 0.76* 0.91* 0.85*    Glucose  Recent Labs Lab 11/02/16 2134 11/03/16 0104 11/03/16 0809  GLUCAP 158* 138* 152*    Imaging Ct Abdomen Pelvis Wo Contrast  Result Date: 11/02/2016 CLINICAL DATA:  Abdominal pain.  Recent liver stent placement EXAM: CT ABDOMEN AND PELVIS WITHOUT CONTRAST TECHNIQUE: Multidetector CT imaging of the abdomen and pelvis was performed following the standard protocol without IV contrast. COMPARISON:  08/11/2015 FINDINGS: Lower chest: Dense calcifications in the visualized right coronary artery. Lung bases clear.  No effusions. Hepatobiliary: Severely shrunken, nodular liver compatible with cirrhosis. Large dilated recannulized umbilical vein compatible with portal venous hypertension. Prior cholecystectomy. Pancreas: No focal abnormality or ductal dilatation. Spleen: Spleen is normal caliber. Small low-density lesion within the spleen is stable. Adrenals/Urinary Tract: No visible renal mass, stones or hydronephrosis. Adrenal glands unremarkable. Urinary bladder is obscured from beam hardening artifact from the left hip replacement. Stomach/Bowel: Normal appendix. Stomach, large and small bowel grossly unremarkable. Vascular/Lymphatic: Diffuse aortic and iliac calcifications. No aneurysm. No adenopathy. Reproductive: Uterus and adnexa unremarkable.  No mass. Other: No free fluid or free air. Musculoskeletal: Prior left hip replacement. Degenerative disc and facet disease throughout the lumbar spine. No acute bony abnormality. IMPRESSION: Severely nodular, shrunken liver compatible with cirrhosis. Large dilated recannulized umbilical vein compatible with portal venous hypertension. No acute findings in the abdomen or pelvis. Coronary artery disease, aortic  atherosclerosis. Electronically Signed   By: Rolm Baptise M.D.   On: 11/02/2016 16:59   Ct Head Wo Contrast  Result Date: 11/02/2016 CLINICAL DATA:  Altered mental status.  Fever. EXAM: CT HEAD WITHOUT CONTRAST TECHNIQUE: Contiguous axial images were obtained from the base of the skull through the vertex without intravenous contrast. COMPARISON:  August 11, 2015 FINDINGS: Brain: There is age related volume loss, stable. There is mild invagination of CSF into the sella. There is no intracranial mass, hemorrhage, extra-axial fluid collection, or midline shift. There is slight small vessel disease in the centra semiovale bilaterally. Gray-white compartments elsewhere appear normal. No acute infarct evident. Vascular: There is no hyperdense vessel. There is calcification in  the carotid siphon regions bilaterally. There is also calcification in the distal vertebral artery on the left. Skull: The bony calvarium appears intact. Sinuses/Orbits: There is mucosal thickening in several ethmoid air cells bilaterally. There is also mild mucosal thickening in the right maxillary antrum. Other visualized paranasal sinuses are clear. Visualized orbits appear symmetric bilaterally. Other: Visualized mastoid air cells are clear. IMPRESSION: Mild periventricular small vessel disease. No intracranial mass, hemorrhage, or extra-axial fluid collection. No acute infarct evident. There is a mild degree of empty sella. There are foci of arterial vascular calcification. There is mild paranasal sinus disease. Electronically Signed   By: Lowella Grip III M.D.   On: 11/02/2016 15:00   Dg Chest Port 1 View  Result Date: 11/02/2016 CLINICAL DATA:  Altered mental status today.  History of cirrhosis. EXAM: PORTABLE CHEST 1 VIEW COMPARISON:  08/10/2015 FINDINGS: The heart is enlarged. There is aortic atherosclerosis. Markings at the lung bases are slightly prominent. This could be due to a poor inspiration or mild basilar pneumonia. No dense consolidation or lobar collapse. Upper lungs are clear. IMPRESSION: Poor inspiration versus mild basilar pneumonia. Electronically Signed   By: Nelson Chimes M.D.   On: 11/02/2016 15:04   STUDIES:  CT Abd Pelvis 01/2>>Severely nodular, shrunken liver compatible with cirrhosis. Large dilated recannulized umbilical vein compatible with portal venous hypertension. No acute findings in the abdomen or pelvis. Coronary artery disease, aortic atherosclerosis. CT Head 01/2>>Mild periventricular small vessel disease. No intracranial mass, hemorrhage, or extra-axial fluid collection. No acute infarct evident. There is a mild degree of empty sella. There are foci of arterial vascular calcification. There is mild paranasal sinus disease.  CULTURES: Blood x1 bottle  01/2>>staphylococcus species  Blood x1 anaerobic bottle only 01/2>>gram positive cocci  Blood x1 01/2>>negative  Urine  01/2>>  ANTIBIOTICS: Zosyn 01/2>> Vancomycin 01/2>>x1 dose   SIGNIFICANT EVENTS: 01/2-Pt admitted to [facility] ICU due to hepatic encephalopathy and septic shock secondary to pneumonia  01/3-PCCM consulted for further management   LINES/TUBES: PIV's x2 01/2>>  ASSESSMENT / PLAN:  PULMONARY A: Acute hypercapnic hypoxic respiratory failure secondary to pneumonia  P:   Continuous Bipap for now wean as tolerated Prn bronchodilators CXR in am 01/3 Repeat ABG 1 hour post initiation of Bipap 01/3 Prn ABG's Once more alert pulmonary hygiene HIGH risk for intubatation  CARDIOVASCULAR A:  Elevated troponin's secondary to demand ischemia vs. NSTEMI Hypotension secondary to septic shock Hx: HTN P:  Maintain map >65 Aggressive fluid resuscitation if map remains <65 will start levophed gtt Trend troponin's Cardiology consulted appreciate input Continuous telemetry monitoring  Hold outpatient lasix, amlodipine, quinapril, sotalol, and spironolactone Continue outpatient aspirin and plavix  Prn metoprolol for heart rate greater than 120 Continue Heparin gtt  RENAL A:   Acute renal failure likely  secondary to hypotension Lactic acidosis  P:   Trend BMP's  Replace electrolytes as indicated  Monitor uop Repeat lactic acid   GASTROINTESTINAL A:   Hyperammonemia  Hx: Hepatitis C and Cirrhosis of liver without ascites  P:   Keep NPO for now NG tube to administer medications Continue lactulose   HEMATOLOGIC A:   Anemia  P:  Trend CBC's Heparin gtt for VTE prophylaxis  Monitor for s/sx of bleeding Transfuse for hgb <7  INFECTIOUS A:   Leukocytosis  Pneumonia  P:   Trend WBC's and monitor fever curve Trend PCT's and lactic acids Continue abx as listed above Follow cultures   ENDOCRINE A:   Diabetes Mellitus P:   CBG's q4  hrs SSI  NEUROLOGIC A:   Hepatic encephalopathy secondary to Cirrhosis and Hepatocellular Carcinoma P:   Avoid sedating medications Continue lactulose Frequent reorientation Lights on during the day    FAMILY  - Updates: Pts daughter updated about plan of care and questions answered 01/3.  According to pts daughter she states the pt is a FULL CODE  - Inter-disciplinary family meet or Palliative Care meeting due by:  11/10/2015  Marda Stalker, [place] Pager [phone] (please enter 7 digits) [place] Pager [phone] (please enter 7 digits)  -The patient was seen with the NP the above is reflective of my evaluation, I agree with the findings, assessment, plan as amended.  Pt appears to have severe encephalopathy likely multifactorial from sepsis, and hepatic encephalopathy. On initial evaluation the patient was awake but very lethargic and minimally responsive, she would not follow commands, she was on bipap with continued acidosis, therefore decision was made to intubate the patient. Case discussed with hospitalist physician who is discussing with [facility] for possible transfer.   Marda Stalker, M.D 11/03/2016  Critical Care Attestation.  I have personally obtained a history, examined the patient, evaluated laboratory and imaging results, formulated the assessment and plan and placed orders. The Patient requires high complexity decision making for assessment and support, frequent evaluation and titration of therapies, application of advanced monitoring technologies and extensive interpretation of multiple databases. The patient has critical illness that could lead imminently to failure of 1 or more organ systems and requires the highest level of physician preparedness to intervene.  Critical Care Time devoted to patient care services described in this note is 45 minutes and is exclusive of time spent in procedures.

## 2016-11-03 NOTE — Progress Notes (Signed)
Xray verification of central line placement.  Per Marda Stalker, NP okay to use central line.

## 2016-11-03 NOTE — Progress Notes (Signed)
RN spoke with [name], NP and made her aware that patient has not voided today and that bladder scan result is 300 cc and patient had to be in and out cath last night.  NP gave order to insert foley catheter.

## 2016-11-04 ENCOUNTER — Inpatient Hospital Stay: Payer: Medicare Other

## 2016-11-04 ENCOUNTER — Inpatient Hospital Stay
Admit: 2016-11-04 | Discharge: 2016-11-04 | Disposition: A | Discharge status: Home / Self Care | Payer: Medicare Other | Attending: Cardiovascular Disease | Admitting: Cardiovascular Disease

## 2016-11-04 DIAGNOSIS — K7290 Hepatic failure, unspecified without coma: Secondary | ICD-10-CM

## 2016-11-04 LAB — BLOOD GAS, ARTERIAL
Acid-base deficit: 7.1 mmol/L — ABNORMAL HIGH (ref 0.0–2.0)
Acid-base deficit: 6.2 mmol/L — ABNORMAL HIGH (ref 0.0–2.0)
Bicarbonate: 20.5 mmol/L (ref 20.0–28.0)
Bicarbonate: 21.4 mmol/L (ref 20.0–28.0)
FIO2: 0.4
FIO2: 0.4
MECHANICAL RATE: 14
MECHVT: 500 mL
O2 SAT: 83.7 %
O2 Saturation: 91.9 %
PCO2 ART: 51 mmHg — AB (ref 32.0–48.0)
PEEP: 5 cmH2O
PEEP: 5 cmH2O
Patient temperature: 37
Patient temperature: 37
RATE: 14 resp/min
VT: 500 mL
pCO2 arterial: 50 mmHg — ABNORMAL HIGH (ref 32.0–48.0)
pH, Arterial: 7.22 — ABNORMAL LOW (ref 7.350–7.450)
pH, Arterial: 7.23 — ABNORMAL LOW (ref 7.350–7.450)
pO2, Arterial: 76 mmHg — ABNORMAL LOW (ref 83.0–108.0)
pO2, Arterial: 58 mmHg — ABNORMAL LOW (ref 83.0–108.0)

## 2016-11-04 LAB — BASIC METABOLIC PANEL
ANION GAP: 3 — AB (ref 5–15)
BUN: 29 mg/dL — ABNORMAL HIGH (ref 6–20)
CALCIUM: 8 mg/dL — AB (ref 8.9–10.3)
CO2: 21 mmol/L — ABNORMAL LOW (ref 22–32)
Chloride: 116 mmol/L — ABNORMAL HIGH (ref 101–111)
Creatinine, Ser: 2.54 mg/dL — ABNORMAL HIGH (ref 0.44–1.00)
GFR, EST AFRICAN AMERICAN: 21 mL/min — AB (ref >60)
GFR, EST NON AFRICAN AMERICAN: 18 mL/min — AB (ref >60)
GLUCOSE: 227 mg/dL — AB (ref 65–99)
POTASSIUM: 3.8 mmol/L (ref 3.5–5.1)
Sodium: 140 mmol/L (ref 135–145)

## 2016-11-04 LAB — GLUCOSE, CAPILLARY
GLUCOSE-CAPILLARY: 225 mg/dL — AB (ref 65–99)
GLUCOSE-CAPILLARY: 282 mg/dL — AB (ref 65–99)
Glucose-Capillary: 224 mg/dL — ABNORMAL HIGH (ref 65–99)
Glucose-Capillary: 227 mg/dL — ABNORMAL HIGH (ref 65–99)

## 2016-11-04 LAB — CBC
HCT: 28.7 % — ABNORMAL LOW (ref 35.0–47.0)
Hemoglobin: 9.6 g/dL — ABNORMAL LOW (ref 12.0–16.0)
MCH: 34 pg (ref 26.0–34.0)
MCHC: 33.5 g/dL (ref 32.0–36.0)
MCV: 101.5 fL — ABNORMAL HIGH (ref 80.0–100.0)
PLATELETS: 105 10*3/uL — AB (ref 150–440)
RBC: 2.83 MIL/uL — AB (ref 3.80–5.20)
RDW: 17.7 % — AB (ref 11.5–14.5)
WBC: 16.3 10*3/uL — AB (ref 3.6–11.0)

## 2016-11-04 LAB — ECHOCARDIOGRAM COMPLETE
HEIGHTINCHES: 63 in
WEIGHTICAEL: 3283.97 [oz_av]

## 2016-11-04 LAB — LACTIC ACID, PLASMA: LACTIC ACID, VENOUS: 2.1 mmol/L — AB (ref 0.5–1.9)

## 2016-11-04 LAB — PROCALCITONIN: PROCALCITONIN: 0.3 ng/mL

## 2016-11-04 LAB — MAGNESIUM: MAGNESIUM: 2.4 mg/dL (ref 1.7–2.4)

## 2016-11-04 MED ORDER — HEPARIN SODIUM (PORCINE) 5000 UNIT/ML IJ SOLN
5000.0000 [IU] | Freq: Three times a day (TID) | INTRAMUSCULAR | Status: DC
  Administered 2016-11-04: 5000 [IU] via SUBCUTANEOUS
  Filled 2016-11-04: qty 1

## 2016-11-04 MED ORDER — ROCURONIUM BROMIDE 50 MG/5ML IV SOLN
INTRAVENOUS | Status: AC
  Filled 2016-11-04: qty 1

## 2016-11-04 MED ORDER — CHLORHEXIDINE GLUCONATE 0.12% ORAL RINSE (MEDLINE KIT)
15.0000 mL | Freq: Two times a day (BID) | OROMUCOSAL | Status: DC
  Administered 2016-11-04: 15 mL via OROMUCOSAL

## 2016-11-04 MED ORDER — METOPROLOL TARTRATE 5 MG/5ML IV SOLN
5.0000 mg | INTRAVENOUS | Status: AC
  Administered 2016-11-04: 5 mg via INTRAVENOUS

## 2016-11-04 MED ORDER — VITAL HIGH PROTEIN PO LIQD
1000.0000 mL | ORAL | Status: DC
  Administered 2016-11-04: 1000 mL

## 2016-11-04 MED ORDER — METOPROLOL TARTRATE 5 MG/5ML IV SOLN
INTRAVENOUS | Status: AC
  Filled 2016-11-04: qty 5

## 2016-11-04 MED ORDER — ORAL CARE MOUTH RINSE
15.0000 mL | Freq: Four times a day (QID) | OROMUCOSAL | Status: DC
  Administered 2016-11-04 (×2): 15 mL via OROMUCOSAL

## 2016-11-04 MED ORDER — FENTANYL CITRATE (PF) 100 MCG/2ML IJ SOLN
INTRAMUSCULAR | Status: AC
  Filled 2016-11-04: qty 4

## 2016-11-04 MED ORDER — MIDAZOLAM HCL 2 MG/2ML IJ SOLN
INTRAMUSCULAR | Status: AC
  Filled 2016-11-04: qty 4

## 2016-11-04 NOTE — Progress Notes (Signed)
Pharmacy Antibiotic Note/ Electrolyte Monitoring  Yvonne Mcdaniel is a 71 y.o. female with a h/o cirrhosis admitted on 11/02/2016 with sepsis and SVT.  Pharmacy has been consulted for Zosyn dosing.   Plan:  Continue Zosyn EI 3.375g IV Q8hr.     Height: 5\' 3"  (160 cm) Weight: 205 lb 4 oz (93.1 kg) IBW/kg (Calculated) : 52.4  Temp (24hrs), Avg:98.3 F (36.8 C), Min:97.8 F (36.6 C), Max:98.7 F (37.1 C)   Recent Labs Lab 11/02/16 1444  11/02/16 2132 11/03/16 0421 11/03/16 0755 11/03/16 2120 11/04/16 0555 11/04/16 1104  WBC 13.3*  --   --  16.4*  --   --  16.3*  --   CREATININE 0.95  --   --  1.23*  --   --  2.54*  --   LATICACIDVEN 9.0*  < > 6.8* 3.1* 2.2* 3.6*  --  2.1*  < > = values in this interval not displayed.  Estimated Creatinine Clearance: 23 mL/min (by C-G formula based on SCr of 2.54 mg/dL (H)).    No Known Allergies  Antimicrobials this admission: Vancomycin 1/2 >> 1/3 Zosyn 1/2 >>   Dose adjustments this admission:   Microbiology results: 1/2 BCx: GPC (Staph) 1/2 1/2 UCx: negative  1/3 MRSA PCR: negative  Pharmacy will continue to monitor and adjust per consult.   Simpson,Michael L 11/04/2016 4:26 PM

## 2016-11-04 NOTE — Progress Notes (Signed)
[name], NP made aware of Lactic of 2.1. No new orders at this time.

## 2016-11-04 NOTE — Progress Notes (Signed)
*  PRELIMINARY RESULTS* Echocardiogram 2D Echocardiogram has been performed.  Sherrie Sport 11/04/2016, 11:39 AM

## 2016-11-04 NOTE — Progress Notes (Signed)
[facility] [place] NAME: Yvonne Mcdaniel    MR#:  [id number]  DATE OF BIRTH:  [birth year]  SUBJECTIVE:  CHIEF COMPLAINT:   Chief Complaint  Patient presents with  . Altered Mental Status   Intubated on pressors. Critically ill  Poor prognosis due to cirrhosis  REVIEW OF SYSTEMS:    Review of Systems  Unable to perform ROS: Mental status change    DRUG ALLERGIES:  No Known Allergies  VITALS:  Blood pressure (!) 120/45, pulse (!) 124, temperature 98.4 F (36.9 C), temperature source Axillary, resp. rate (!) 23, height 5\' 3"  (1.6 m), weight 93.1 kg (205 lb 4 oz), SpO2 94 %.  PHYSICAL EXAMINATION:   Physical Exam  GENERAL:  71 y.o.-year-old patient lying in the bed with no acute distress.  HEENT: Head atraumatic, normocephalic. Oropharynx and nasopharynx clear. ETT in place NECK:  Supple, no jugular venous distention. No thyroid enlargement, no tenderness.  LUNGS: Bilateral wheezing CARDIOVASCULAR: S1, S2 tachycardia ABDOMEN: Soft, nontender, nondistended. Bowel sounds present. No organomegaly or mass.  EXTREMITIES: No cyanosis, clubbing or edema b/l.    SKIN: No obvious rash, lesion, or ulcer.   LABORATORY PANEL:   CBC  Recent Labs Lab 11/04/16 0555  WBC 16.3*  HGB 9.6*  HCT 28.7*  PLT 105*   ------------------------------------------------------------------------------------------------------------------ Chemistries   Recent Labs Lab 11/02/16 1444  11/04/16 0555  NA 142  < > 140  K 3.7  < > 3.8  CL 110  < > 116*  CO2 19*  < > 21*  GLUCOSE 134*  < > 227*  BUN 15  < > 29*  CREATININE 0.95  < > 2.54*  CALCIUM 9.0  < > 8.0*  MG  --   < > 2.4  AST 110*  --   --   ALT 61*  --   --   ALKPHOS 174*  --   --   BILITOT 4.0*  --   --   < > = values in this interval not displayed.------------------------------------------------------------------------------------------------------------------ Cardiac Enzymes  Recent  Labs Lab 11/03/16 0336  TROPONINI 0.85*  ------------------------------------------------------------------------------------------------------------------ RADIOLOGY:  Ct Abdomen Pelvis Wo Contrast  Result Date: 11/02/2016 CLINICAL DATA:  Abdominal pain.  Recent liver stent placement EXAM: CT ABDOMEN AND PELVIS WITHOUT CONTRAST TECHNIQUE: Multidetector CT imaging of the abdomen and pelvis was performed following the standard protocol without IV contrast. COMPARISON:  08/11/2015 FINDINGS: Lower chest: Dense calcifications in the visualized right coronary artery. Lung bases clear. No effusions. Hepatobiliary: Severely shrunken, nodular liver compatible with cirrhosis. Large dilated recannulized umbilical vein compatible with portal venous hypertension. Prior cholecystectomy. Pancreas: No focal abnormality or ductal dilatation. Spleen: Spleen is normal caliber. Small low-density lesion within the spleen is stable. Adrenals/Urinary Tract: No visible renal mass, stones or hydronephrosis. Adrenal glands unremarkable. Urinary bladder is obscured from beam hardening artifact from the left hip replacement. Stomach/Bowel: Normal appendix. Stomach, large and small bowel grossly unremarkable. Vascular/Lymphatic: Diffuse aortic and iliac calcifications. No aneurysm. No adenopathy. Reproductive: Uterus and adnexa unremarkable.  No mass. Other: No free fluid or free air. Musculoskeletal: Prior left hip replacement. Degenerative disc and facet disease throughout the lumbar spine. No acute bony abnormality. IMPRESSION: Severely nodular, shrunken liver compatible with cirrhosis. Large dilated recannulized umbilical vein compatible with portal venous hypertension. No acute findings in the abdomen or pelvis. Coronary artery disease, aortic atherosclerosis. Electronically Signed   By: Rolm Baptise M.D.   On: 11/02/2016 16:59   Ct Head Wo  Contrast  Result Date: 11/02/2016 CLINICAL DATA:  Altered mental status.  Fever. EXAM: CT  HEAD WITHOUT CONTRAST TECHNIQUE: Contiguous axial images were obtained from the base of the skull through the vertex without intravenous contrast. COMPARISON:  August 11, 2015 FINDINGS: Brain: There is age related volume loss, stable. There is mild invagination of CSF into the sella. There is no intracranial mass, hemorrhage, extra-axial fluid collection, or midline shift. There is slight small vessel disease in the centra semiovale bilaterally. Gray-white compartments elsewhere appear normal. No acute infarct evident. Vascular: There is no hyperdense vessel. There is calcification in the carotid siphon regions bilaterally. There is also calcification in the distal vertebral artery on the left. Skull: The bony calvarium appears intact. Sinuses/Orbits: There is mucosal thickening in several ethmoid air cells bilaterally. There is also mild mucosal thickening in the right maxillary antrum. Other visualized paranasal sinuses are clear. Visualized orbits appear symmetric bilaterally. Other: Visualized mastoid air cells are clear. IMPRESSION: Mild periventricular small vessel disease. No intracranial mass, hemorrhage, or extra-axial fluid collection. No acute infarct evident. There is a mild degree of empty sella. There are foci of arterial vascular calcification. There is mild paranasal sinus disease. Electronically Signed   By: Lowella Grip III M.D.   On: 11/02/2016 15:00   Dg Chest Port 1 View  Result Date: 11/04/2016 CLINICAL DATA:  Respiratory failure. EXAM: PORTABLE CHEST 1 VIEW COMPARISON:  11/03/2016. FINDINGS: Endotracheal tube 1.2 cm above the carina. Withdrawal of approximately 2 cm should be considered. Left IJ line and NG tube in stable position. Persistent cardiomegaly with mild pulmonary vascular prominence and interstitial prominence again noted. Findings suggest mild CHF. Small left pleural effusion. Low lung volumes again noted. No pneumothorax . IMPRESSION: 1. Endotracheal tube 1.2 cm above  the carina. Withdrawal approximately 2 cm should be considered. Left IJ line NG tube in stable position. 2. Persistent cardiomegaly with mild pulmonary vascular prominence interstitial prominence. Small left pleural effusion . again noted. Findings consistent with mild CHF. No significant change. 3. Persistent low lung volumes. Electronically Signed   By: Marcello Moores  Register   On: 11/04/2016 06:46   Dg Chest Port 1 View  Result Date: 11/03/2016 CLINICAL DATA:  Intubation. EXAM: PORTABLE CHEST 1 VIEW COMPARISON:  01/03/ 2018. FINDINGS: Interim placement endotracheal tube, its tip is 2.8 cm above the carina. Interim placement of left IJ line, its tip is projected superior vena cava. NG tube in stable position. Cardiomegaly with pulmonary venous congestion. Mild interstitial prominence noted. Findings consistent with mild CHF. Low lung volumes. Small left pleural effusion. No pneumothorax. Pacing pads are noted. No acute bony abnormality . IMPRESSION: 1. Interim placement endotracheal tube and left IJ line. Tips are in good anatomic position. NG tube in stable position. 2. Cardiomegaly with pulmonary venous congestion, bilateral pulmonary interstitial prominence, small left pleural effusion. Findings suggest mild congestive heart failure . Low lung volumes. Electronically Signed   By: Marcello Moores  Register   On: 11/03/2016 15:27   Dg Chest Port 1 View  Result Date: 11/03/2016 CLINICAL DATA:  Acute respiratory failure. EXAM: PORTABLE CHEST 1 VIEW COMPARISON:  One-view chest x-ray 11/02/2016 FINDINGS: The heart is enlarged. The side port of an NG tube is in the fundus of the stomach. Slight increase in interstitial prominence suggests mild edema. Left basilar airspace disease is present. Atherosclerotic calcifications are present at the aortic arch. Degenerative changes are noted in the shoulders. IMPRESSION: 1. Cardiomegaly and increasing interstitial prominence suggesting edema and congestive heart failure. 2. Left  lower  lobe airspace disease likely reflects atelectasis. Infection is not excluded. 3. The side port of the NG tube is in the fundus of the stomach. Electronically Signed   By: San Morelle M.D.   On: 11/03/2016 12:39   Dg Chest Port 1 View  Result Date: 11/02/2016 CLINICAL DATA:  Altered mental status today.  History of cirrhosis. EXAM: PORTABLE CHEST 1 VIEW COMPARISON:  08/10/2015 FINDINGS: The heart is enlarged. There is aortic atherosclerosis. Markings at the lung bases are slightly prominent. This could be due to a poor inspiration or mild basilar pneumonia. No dense consolidation or lobar collapse. Upper lungs are clear. IMPRESSION: Poor inspiration versus mild basilar pneumonia. Electronically Signed   By: Nelson Chimes M.D.   On: 11/02/2016 15:04   Dg Abd Portable 1v  Result Date: 11/03/2016 CLINICAL DATA:  NG tube placement. EXAM: PORTABLE ABDOMEN - 1 VIEW COMPARISON:  One-view chest x-ray 11/02/2016. CT of the abdomen and pelvis 11/02/2016. FINDINGS: The side port of the NG tube is in the fundus of the stomach. There is mild gaseous distention of small bowel without obstruction. Minimal bibasilar airspace disease is stable. IMPRESSION: 1. The side port of the NG tube is in the fundus of the stomach. 2. Mild gaseous distension small bowel without obstruction. Electronically Signed   By: San Morelle M.D.   On: 11/03/2016 12:38   ASSESSMENT AND PLAN:   71 year old female with past medical history of hepatitis C, SVT, diabetes, hypertension, history of recent cardiac stent placement with coronary artery disease who presented to the hospital due to altered mental status.   1. Sepsis shock due to bibasilar pneumonia with acute hypoxic and hypercapnic respiratory failure - IV antibiotics. Fluid resuscitation. - Discussed with the critical care team. - Levophed drip - Intubated. 40% O2 on vent  2. SVT-patient has a previous history of SVT and currently rates are elevated due to  underlying sepsis/encephalopathy. - Cardiology consultation.  3. History of coronary artery disease status post recent stent placement - patient's troponins mildly elevated secondary to tachycardia and sepsis - we will cycle cardiac markers, continue aspirin, Plavix. Stop heparin drip. Not NSTEMI. On ASA, plavix. Discussed with Dr. Humphrey Rolls - Cardiology  4. Diabetes type 2   5.  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 Management plans discussed with the patient, family and they are in agreement.  CODE STATUS: Full Code  Patient accepted in transfer to [facility]. On waiting list  TOTAL CC TIME TAKING CARE OF THIS PATIENT: 35 minutes.   Hillary Bow R M.D on 11/04/2016 at 1:37 PM  Between 7am to 6pm - Pager - [phone]  After 6pm go to [email or web address] - password [email or web address] [facility]  Office  [phone]  CC: Primary care physician; No primary care provider on file.  Note: This dictation was prepared with Dragon dictation along with smaller phrase technology. Any transcriptional errors that result from this process are unintentional.

## 2016-11-04 NOTE — Therapy (Signed)
Patient noted on routine rounds to have an increased work of breathing/abdominal respirations. ETCO2 56, HR 128. Set rate increased to 14. Patient appears more comfortable, ETCO2 decreased to 45. ABG pending.

## 2016-11-04 NOTE — Progress Notes (Signed)
Report given to Production assistant, radio from [facility] and [facility] transport. No further questions at this time. Transport will call us with an estimated time.

## 2016-11-04 NOTE — Progress Notes (Signed)
Inpatient Diabetes Program Recommendations  AACE/ADA: New Consensus Statement on Inpatient Glycemic Control (2015)  Target Ranges:  Prepandial:   less than 140 mg/dL      Peak postprandial:   less than 180 mg/dL (1-2 hours)      Critically ill patients:  140 - 180 mg/dL   Results for Yvonne Mcdaniel, Yvonne Mcdaniel (MRN [id number]) as of 11/04/2016 11:49  Ref. Range 11/03/2016 23:48 11/04/2016 04:06 11/04/2016 08:00 11/04/2016 11:33  Glucose-Capillary Latest Ref Range: 65 - 99 mg/dL 193 (H) 225 (H) 224 (H) 227 (H)    Admit with: AMS  History: DM, Cirrhosis  Home DM Meds: Lantus 15 units QHS       Novolog 3-12 units TID per SSI  Current Insulin Orders: Novolog Sensitive Correction Scale/ SSI (0-9 units) Q4 hours      MD- Note patient Intubated.  Currently receiving Vital tube feeds at 40cc/hr.  Goal rate of tube feeds 55cc/hr.  Glucose levels >200 mg/dl since midnight.  MD- Please consider the following in-hospital insulin adjustments:  Start ICU Glycemic Control Protocol- Phase 2 (IV Insulin)  OR  Start Novolog tube feed coverage: Novolog 4 units Q4 hours (hold if tube feeds held for any reason)      --Will follow patient during hospitalization--  Wyn Quaker RN, MSN, CDE Diabetes Coordinator Inpatient Glycemic Control Team Team Pager: [phone] (8a-5p)

## 2016-11-04 NOTE — Discharge Summary (Signed)
DISCHARGE SUMMARY PULMONARY / CRITICAL CARE MEDICINE   Name: Yvonne Mcdaniel MRN: [id number] DOB: [birth year]    ADMISSION DATE:  11/02/2016 CONSULTATION DATE:  11/03/2016  DISCHARGE DX  LIVER CIRRHOSIS ENCEPHALOPATHY  REFERRING MD:  Dr. Darvin Neighbours   CHIEF COMPLAINT:  Altered mental status  Patient Profile   This is a 71 yo female with a PMH of HTN, Hepatitis C (diagnosed 2016), Hepatocellular Carcinoma (Currently on liver transplant list at [facility]), Cirrhosis of liver without ascites, Liver lesion, Peripheral neuropathy, Hepatic encephalopathy, Atrial flutter, CAD s/p stent placement, and Diabetes Mellitus.  She presented to [facility] ER 01/2 with altered mental status.  According to pts family they had been trying to call the pt over the past 2 days prior to presentation to the ER, however were unable to get in contact with her.  Therefore, a family member went to the pts home and found that the pt was confused and repeating the same thing over and over again, therefore she was transported to the ER for further evaluation.  Per family at baseline the pt is normally alert and oriented.  In the ER the pt was found to be tachycardic with an elevated lactic acid.  CXR revealed possible pneumonia and she was also thought to be septic.  She was subsequently admitted to the ICU.  PCCM consulted 01/3 for further management of hepatic encephalopathy and septic shock secondary to pneumonia.   HOSPITAL COURSE 1/3 intubated 1/4 remains critically ill  Family request transfer to [place]:   Unable to assess pt confused and lethargic   SUBJECTIVE:  Unable to assess pt confused and lethargic   VITAL SIGNS: BP (!) 120/45   Pulse (!) 124   Temp 98.4 F (36.9 C) (Axillary)   Resp (!) 23   Ht '5\' 3"'  (1.6 m)   Wt 205 lb 4 oz (93.1 kg)   SpO2 94%   BMI 36.36 kg/m   HEMODYNAMICS: CVP:  [10 mmHg-18 mmHg] 13 mmHg  VENTILATOR SETTINGS: Vent Mode: PRVC FiO2 (%):  [39 %-100 %] 40 % Set Rate:  [8  bmp-16 bmp] 14 bmp Vt Set:  [500 mL] 500 mL PEEP:  [5 cmH20] 5 cmH20 Plateau Pressure:  [13 cmH20] 13 cmH20  INTAKE / OUTPUT: I/O last 3 completed shifts: In: 1566.8 [I.V.:696.8; NG/GT:170; IV Piggyback:700] Out: 400 [Urine:400]  PHYSICAL EXAMINATION: General:  Acutely ill obese Caucasian female, well developed, well nourished  Neuro:  Opens eyes to voice but does not follow any commands HEENT:  Supple, no JVD Cardiovascular:  Sinus tach, s1s2, no M/R/G Lungs:Scattered rhonchi , no wheezes, crackles and rhonchi noted Abdomen:  Hypoactive BS x4, soft, tender, non distended Musculoskeletal:  Trace edema bilateral lower extremities, moves all extremities Skin: scattered skin tears and ecchymosis   LABS:  BMET  Recent Labs Lab 11/02/16 1444 11/03/16 0421 11/04/16 0555  NA 142 144 140  K 3.7 4.2 3.8  CL 110 115* 116*  CO2 19* 23 21*  BUN 15 18 29*  CREATININE 0.95 1.23* 2.54*  GLUCOSE 134* 169* 227*    Electrolytes  Recent Labs Lab 11/02/16 1444 11/03/16 0421 11/04/16 0555  CALCIUM 9.0 8.2* 8.0*  MG  --  1.4* 2.4  PHOS  --  4.3  --     CBC  Recent Labs Lab 11/02/16 1444 11/03/16 0421 11/04/16 0555  WBC 13.3* 16.4* 16.3*  HGB 11.6* 10.4* 9.6*  HCT 34.5* 30.7* 28.7*  PLT 133* 109* 105*    Coag's  Recent Labs Lab 11/03/16 0336  APTT 43*  INR 2.09    Sepsis Markers  Recent Labs Lab 11/03/16 0421 11/03/16 0755 11/03/16 2120 11/04/16 0555 11/04/16 1104  LATICACIDVEN 3.1* 2.2* 3.6*  --  2.1*  PROCALCITON 0.12  --   --  0.30  --     ABG  Recent Labs Lab 11/03/16 1300 11/03/16 1450 11/04/16 1210  PHART 7.23* 7.31* 7.22*  PCO2ART 48 40 50*  PO2ART 43* 385* 76*    Liver Enzymes  Recent Labs Lab 11/02/16 1444  AST 110*  ALT 61*  ALKPHOS 174*  BILITOT 4.0*  ALBUMIN 2.8*    Cardiac Enzymes  Recent Labs Lab 11/02/16 1810 11/02/16 2243 11/03/16 0336  TROPONINI 0.76* 0.91* 0.85*    Glucose  Recent Labs Lab  11/03/16 1616 11/03/16 1945 11/03/16 2348 11/04/16 0406 11/04/16 0800 11/04/16 1133  GLUCAP 144* 174* 193* 225* 224* 227*    Imaging Dg Chest Port 1 View  Result Date: 11/04/2016 CLINICAL DATA:  Respiratory failure. EXAM: PORTABLE CHEST 1 VIEW COMPARISON:  11/03/2016. FINDINGS: Endotracheal tube 1.2 cm above the carina. Withdrawal of approximately 2 cm should be considered. Left IJ line and NG tube in stable position. Persistent cardiomegaly with mild pulmonary vascular prominence and interstitial prominence again noted. Findings suggest mild CHF. Small left pleural effusion. Low lung volumes again noted. No pneumothorax . IMPRESSION: 1. Endotracheal tube 1.2 cm above the carina. Withdrawal approximately 2 cm should be considered. Left IJ line NG tube in stable position. 2. Persistent cardiomegaly with mild pulmonary vascular prominence interstitial prominence. Small left pleural effusion . again noted. Findings consistent with mild CHF. No significant change. 3. Persistent low lung volumes. Electronically Signed   By: Marcello Moores  Register   On: 11/04/2016 06:46   STUDIES:  CT Abd Pelvis 01/2>>Severely nodular, shrunken liver compatible with cirrhosis. Large dilated recannulized umbilical vein compatible with portal venous hypertension. No acute findings in the abdomen or pelvis. Coronary artery disease, aortic atherosclerosis. CT Head 01/2>>Mild periventricular small vessel disease. No intracranial mass, hemorrhage, or extra-axial fluid collection. No acute infarct evident. There is a mild degree of empty sella. There are foci of arterial vascular calcification. There is mild paranasal sinus disease.  CULTURES: Blood x1 bottle 01/2>>staphylococcus species  Blood x1 anaerobic bottle only 01/2>>gram positive cocci  Blood x1 01/2>>negative  Urine  01/2>>  ANTIBIOTICS: Zosyn 01/2>> Vancomycin 01/2>>x1 dose   SIGNIFICANT EVENTS: 01/2-Pt admitted to [facility] [address] ICU due to hepatic encephalopathy and  septic shock secondary to pneumonia  01/3-PCCM consulted for further management   LINES/TUBES: PIV's x2 01/2>>  ASSESSMENT / PLAN:  PULMONARY A: Acute hypercapnic hypoxic respiratory failure secondary to pneumonia  P:   Continuous Bipap for now wean as tolerated Prn bronchodilators Routine CXR Routine ABG Continue Zosyn  CARDIOVASCULAR A:  Elevated troponin's secondary to demand ischemia vs. NSTEMI Hypotension secondary to septic shock Hx: HTN P:  Maintain map >65 Aggressive fluid resuscitation if map remains <65 will start levophed gtt Trend troponin's Cardiology consulted appreciate input Continuous telemetry monitoring  Hold outpatient lasix, amlodipine, quinapril, sotalol, and spironolactone Continue outpatient aspirin and plavix  Prn metoprolol for heart rate greater than 120 F/u ECHO Levo gtt    RENAL A:   Acute renal failure likely secondary to hypotension Lactic acidosis  P:   Trend BMP's  Replace electrolytes as indicated  Monitor uop Lactic acid-3.6   GASTROINTESTINAL A:   Hyperammonemia  Hx: Hepatitis C and Cirrhosis of liver without ascites  P:  Keep NPO for now NG tube to administer medications Continue lactulose   HEMATOLOGIC A:   Anemia of chronic disease P:  Trend CBC's Heparin gtt for VTE prophylaxis  Monitor for s/sx of bleeding Transfuse for hgb <7  INFECTIOUS A:   Leukocytosis  Pneumonia  P:   Trend WBC's and monitor fever curve Continue abx as listed above Follow cultures   ENDOCRINE A:   Diabetes Mellitus P:   CBG's q4 hrs SSI  NEUROLOGIC A:   Hepatic encephalopathy secondary to Cirrhosis and Hepatocellular Carcinoma P:   Avoid sedating medications Continue lactulose WUA trials Lights on during the day    CURRENT MEDICATIONS AS FOLLOWS  Current Facility-Administered Medications:  .  aspirin chewable tablet 81 mg, 81 mg, Per Tube, Daily, Napoleon Form, RPH, 81 mg at 11/04/16 1058 .  budesonide  (PULMICORT) nebulizer solution 0.5 mg, 0.5 mg, Nebulization, BID, Awilda Bill, NP, 0.5 mg at 11/04/16 0835 .  chlorhexidine gluconate (MEDLINE KIT) (PERIDEX) 0.12 % solution 15 mL, 15 mL, Mouth Rinse, BID, Laverle Hobby, MD, 15 mL at 11/04/16 0820 .  clopidogrel (PLAVIX) tablet 75 mg, 75 mg, Oral, Daily, Henreitta Leber, MD, 75 mg at 11/04/16 1058 .  feeding supplement (VITAL HIGH PROTEIN) liquid 1,000 mL, 1,000 mL, Per Tube, Continuous, Laverle Hobby, MD, Last Rate: 50 mL/hr at 11/04/16 1401, 1,000 mL at 11/04/16 1401 .  fentaNYL (SUBLIMAZE) 100 MCG/2ML injection, , , ,  .  fentaNYL (SUBLIMAZE) 2,500 mcg in sodium chloride 0.9 % 250 mL (10 mcg/mL) infusion, 25-400 mcg/hr, Intravenous, Continuous, Awilda Bill, NP, Last Rate: 17.5 mL/hr at 11/04/16 1100, 175 mcg/hr at 11/04/16 1100 .  fentaNYL (SUBLIMAZE) bolus via infusion 50 mcg, 50 mcg, Intravenous, Q1H PRN, Awilda Bill, NP, 50 mcg at 11/03/16 1804 .  fentaNYL (SUBLIMAZE) injection 50 mcg, 50 mcg, Intravenous, Once, Awilda Bill, NP .  free water 200 mL, 200 mL, Per Tube, Q8H, Laverle Hobby, MD, 200 mL at 11/04/16 1400 .  insulin aspart (novoLOG) injection 0-9 Units, 0-9 Units, Subcutaneous, Q4H, Awilda Bill, NP, 3 Units at 11/04/16 1144 .  iopamidol (ISOVUE-300) 61 % injection 30 mL, 30 mL, Oral, Once, Nance Pear, MD .  ipratropium-albuterol (DUONEB) 0.5-2.5 (3) MG/3ML nebulizer solution 3 mL, 3 mL, Nebulization, Q4H PRN, Awilda Bill, NP, 3 mL at 11/03/16 1553 .  ipratropium-albuterol (DUONEB) 0.5-2.5 (3) MG/3ML nebulizer solution 3 mL, 3 mL, Nebulization, Q4H, Awilda Bill, NP, 3 mL at 11/04/16 1142 .  lactulose (CHRONULAC) 10 GM/15ML solution 30 g, 30 g, Per Tube, BID, Laverle Hobby, MD, 30 g at 11/04/16 1058 .  MEDLINE mouth rinse, 15 mL, Mouth Rinse, QID, Laverle Hobby, MD, 15 mL at 11/04/16 1145 .  metoprolol (LOPRESSOR) injection 2.5 mg, 2.5 mg, Intravenous, Q6H PRN, Awilda Bill, NP, 2.5 mg at 11/04/16 0127 .  midazolam (VERSED) 2 MG/2ML injection, , , ,  .  midazolam (VERSED) injection 1 mg, 1 mg, Intravenous, Q15 min PRN, Awilda Bill, NP, 1 mg at 11/04/16 0309 .  midazolam (VERSED) injection 1 mg, 1 mg, Intravenous, Q2H PRN, Awilda Bill, NP .  morphine 4 MG/ML injection 4 mg, 4 mg, Intravenous, Q4H PRN, Lance Coon, MD, 4 mg at 11/04/16 0400 .  norepinephrine (LEVOPHED) 16 mg in dextrose 5 % 250 mL (0.064 mg/mL) infusion, 0-40 mcg/min, Intravenous, Titrated, Srikar Sudini, MD, Last Rate: 12.2 mL/hr at 11/04/16 1527, 13 mcg/min at 11/04/16 1527 .  ondansetron (ZOFRAN) tablet 4  mg, 4 mg, Oral, Q6H PRN **OR** ondansetron (ZOFRAN) injection 4 mg, 4 mg, Intravenous, Q6H PRN, Henreitta Leber, MD .  pantoprazole (PROTONIX) injection 40 mg, 40 mg, Intravenous, Q24H, Raylene Miyamoto, MD, 40 mg at 11/03/16 1722 .  piperacillin-tazobactam (ZOSYN) IVPB 3.375 g, 3.375 g, Intravenous, Q8H, Melissa D Maccia, RPH, 3.375 g at 11/04/16 1058 .  rocuronium (ZEMURON) 50 MG/5ML injection, , , ,  .  sennosides (SENOKOT) 8.8 MG/5ML syrup 5 mL, 5 mL, Per Tube, BID PRN, Awilda Bill, NP .  sodium chloride flush (NS) 0.9 % injection 3 mL, 3 mL, Intravenous, Q12H, Henreitta Leber, MD, 3 mL at 11/04/16 1000     I have personally obtained a history, examined the patient, evaluated Pertinent laboratory and RadioGraphic/imaging results, and  formulated the assessment and plan   The Patient requires high complexity decision making for assessment and support, frequent evaluation and titration of therapies, application of advanced monitoring technologies and extensive interpretation of multiple databases.  Overall, patient is critically ill, prognosis is guarded.  Patient with Multiorgan failure and at high risk for cardiac arrest and death.     PATIENT HAS BEEN ACCEPTED TO [facility]  ICU  Maretta Bees Patricia Pesa, M.D.  [facility]  Medical Director  [place] Director [facility] Cardio-Pulmonary Department

## 2016-11-04 NOTE — Progress Notes (Signed)
Nutrition Follow-up  DOCUMENTATION CODES:   Not applicable  INTERVENTION:  -Nutritional needs reassessed post intubation. Recommend changing enteral formula to Vital High Protein with goal of 50 ml/hr providing 1200 kcals, 106 g of protein, 1008 mL of free water. Meets 100% estimated protein and calorie needs.  -Pt with no BM since admission, on lactulose for liver cirrhosis. Recommend further intervention regarding bowel regimen  NUTRITION DIAGNOSIS:   Inadequate oral intake related to acute illness as evidenced by NPO status.  Being addressed via TF  GOAL:   Patient will meet greater than or equal to 90% of their needs  MONITOR:   TF tolerance, Labs, Weight trends, I & O's  REASON FOR ASSESSMENT:   Consult Enteral/tube feeding initiation and management  ASSESSMENT:   71 yo female admitted with severe sepsis and respiratory failure with bibasilar pneumonia. Pt with hepatic encephalopathy with hx of hepatitis C and liver cirrhosis. Pt with additional hx of CAD, DM, HTN  Pt with decline in respiratory status requiring intubation yesterday, awaiting transfer to [facility] when bed available.  TF initiated yesterday; NG tube in stomach. Pt tolerating Vital 1.5 at rate of 40 ml/hr Labs: FSBS 200s, Creatinine 2.54 Meds: ss novolog, lactulose, versed, fentanyl  Diet Order:  Diet NPO time specified  Skin:  Reviewed, no issues  Last BM:  no documented BM  Height:   Ht Readings from Last 1 Encounters:  11/02/16 5\' 3"  (1.6 m)    Weight:   Wt Readings from Last 1 Encounters:  11/04/16 205 lb 4 oz (93.1 kg)   Filed Weights   11/02/16 1457 11/03/16 1830 11/04/16 0354  Weight: 180 lb (81.6 kg) 202 lb 13.2 oz (92 kg) 205 lb 4 oz (93.1 kg)    BMI:  Body mass index is 36.36 kg/m.  Estimated Nutritional Needs:   Kcal:  1025-1300 kcals  Protein:  >/= 105 g  Fluid:  >/= 1.5 L  EDUCATION NEEDS:   No education needs identified at this time  [place], [place], [place] [phone] Pager  [phone] Weekend/On-Call Pager

## 2016-11-04 NOTE — Progress Notes (Signed)
SUBJECTIVE: patient remains intubated hemodynamically stable   Vitals:   11/04/16 0500 11/04/16 0600 11/04/16 0700 11/04/16 0800  BP: (!) 109/42 (!) 119/41 (!) 129/47 (!) 119/46  Pulse: (!) 119 (!) 115 (!) 116 (!) 118  Resp: 17 16 16 20   Temp:    98.5 F (36.9 C)  TempSrc:    Axillary  SpO2: 97% 97% 97% 96%  Weight:      Height:        Intake/Output Summary (Last 24 hours) at 11/04/16 0855 Last data filed at 11/04/16 0800  Gross per 24 hour  Intake           1811.3 ml  Output              400 ml  Net           1411.3 ml    LABS: Basic Metabolic Panel:  Recent Labs  11/03/16 0421 11/04/16 0555  NA 144 140  K 4.2 3.8  CL 115* 116*  CO2 23 21*  GLUCOSE 169* 227*  BUN 18 29*  CREATININE 1.23* 2.54*  CALCIUM 8.2* 8.0*  MG 1.4* 2.4  PHOS 4.3  --    Liver Function Tests:  Recent Labs  11/02/16 1444  AST 110*  ALT 61*  ALKPHOS 174*  BILITOT 4.0*  PROT 7.0  ALBUMIN 2.8*   No results for input(s): LIPASE, AMYLASE in the last 72 hours. CBC:  Recent Labs  11/02/16 1444 11/03/16 0421 11/04/16 0555  WBC 13.3* 16.4* 16.3*  NEUTROABS 11.7*  --   --   HGB 11.6* 10.4* 9.6*  HCT 34.5* 30.7* 28.7*  MCV 100.5* 100.8* 101.5*  PLT 133* 109* 105*   Cardiac Enzymes:  Recent Labs  11/02/16 1444 11/02/16 1810 11/02/16 2243 11/03/16 0336  CKTOTAL 91  --   --   --   TROPONINI 0.48* 0.76* 0.91* 0.85*   BNP: Invalid input(s): POCBNP D-Dimer: No results for input(s): DDIMER in the last 72 hours. Hemoglobin A1C: No results for input(s): HGBA1C in the last 72 hours. Fasting Lipid Panel: No results for input(s): CHOL, HDL, LDLCALC, TRIG, CHOLHDL, LDLDIRECT in the last 72 hours. Thyroid Function Tests: No results for input(s): TSH, T4TOTAL, T3FREE, THYROIDAB in the last 72 hours.  Invalid input(s): FREET3 Anemia Panel: No results for input(s): VITAMINB12, FOLATE, FERRITIN, TIBC, IRON, RETICCTPCT in the last 72 hours.   PHYSICAL EXAM General: Well  developed, well nourished, in no acute distress HEENT:  Normocephalic and atramatic Neck:  No JVD.  Lungs: Clear bilaterally to auscultation and percussion. Heart: HRRR . Normal S1 and S2 without gallops or murmurs.  Abdomen: Bowel sounds are positive, abdomen soft and non-tender  Msk:  Back normal, normal gait. Normal strength and tone for age. Extremities: No clubbing, cyanosis or edema.   Neuro: Alert and oriented X 3. Psych:  Good affect, responds appropriately  TELEMETRY: sinus tachycardia about 110 bpm  ASSESSMENT AND PLAN: status post SVT and currently in sinus rhythm with setting of sepsis and respiratory failure and cirrhosis of the liver. Cardizem is on hold and may consider,beta blocker in the f but currently since patient is hypotensive not start beta blockers or Cardizem.  Active Problems:   Altered mental status    Nasrin Lanzo A, MD, [facility] 11/04/2016 8:55 AM

## 2016-11-04 NOTE — Progress Notes (Signed)
PULMONARY / CRITICAL CARE MEDICINE   Name: Yvonne Mcdaniel MRN: [id number] DOB: [birth year]    ADMISSION DATE:  11/02/2016 CONSULTATION DATE:  11/03/2016  REFERRING MD:  Dr. Darvin Neighbours   CHIEF COMPLAINT:  Altered mental status  Patient Profile   This is a 71 yo female with a PMH of HTN, Hepatitis C (diagnosed 2016), Hepatocellular Carcinoma (Currently on liver transplant list at [facility]), Cirrhosis of liver without ascites, Liver lesion, Peripheral neuropathy, Hepatic encephalopathy, Atrial flutter, CAD s/p stent placement, and Diabetes Mellitus.  She presented to [facility] ER 01/2 with altered mental status.  According to pts family they had been trying to call the pt over the past 2 days prior to presentation to the ER, however were unable to get in contact with her.  Therefore, a family member went to the pts home and found that the pt was confused and repeating the same thing over and over again, therefore she was transported to the ER for further evaluation.  Per family at baseline the pt is normally alert and oriented.  In the ER the pt was found to be tachycardic with an elevated lactic acid.  CXR revealed possible pneumonia and she was also thought to be septic.  She was subsequently admitted to the ICU.  PCCM consulted 01/3 for further management of hepatic encephalopathy and septic shock secondary to pneumonia.   REVIEW OF SYSTEMS:   Unable to assess pt confused and lethargic   SUBJECTIVE:  Unable to assess pt confused and lethargic   VITAL SIGNS: BP (!) 126/40   Pulse (!) 117   Temp 97.8 F (36.6 C) (Oral)   Resp 16   Ht 5\' 3"  (1.6 m)   Wt 93.1 kg (205 lb 4 oz)   SpO2 97%   BMI 36.36 kg/m   HEMODYNAMICS:    VENTILATOR SETTINGS: Vent Mode: PRVC FiO2 (%):  [39 %-100 %] 40 % Set Rate:  [16 bmp] 16 bmp Vt Set:  [500 mL] 500 mL PEEP:  [5 cmH20] 5 cmH20  INTAKE / OUTPUT: I/O last 3 completed shifts: In: 1566.8 [I.V.:696.8; NG/GT:170; IV Piggyback:700] Out: 200 [Urine:200]  PHYSICAL  EXAMINATION: General:  Acutely ill obese Caucasian female, well developed, well nourished  Neuro:  Opens eyes to voice but does not follow any commands HEENT:  Supple, no JVD Cardiovascular:  Sinus tach, s1s2, no M/R/G Lungs:Scattered rhonchi , no wheezes, crackles and rhonchi noted Abdomen:  Hypoactive BS x4, soft, tender, non distended Musculoskeletal:  Trace edema bilateral lower extremities, moves all extremities Skin: scattered skin tears and ecchymosis   LABS:  BMET  Recent Labs Lab 11/02/16 1444 11/03/16 0421  NA 142 144  K 3.7 4.2  CL 110 115*  CO2 19* 23  BUN 15 18  CREATININE 0.95 1.23*  GLUCOSE 134* 169*    Electrolytes  Recent Labs Lab 11/02/16 1444 11/03/16 0421  CALCIUM 9.0 8.2*  MG  --  1.4*  PHOS  --  4.3    CBC  Recent Labs Lab 11/02/16 1444 11/03/16 0421  WBC 13.3* 16.4*  HGB 11.6* 10.4*  HCT 34.5* 30.7*  PLT 133* 109*    Coag's  Recent Labs Lab 11/03/16 0336  APTT 43*  INR 2.09    Sepsis Markers  Recent Labs Lab 11/03/16 0421 11/03/16 0755 11/03/16 2120  LATICACIDVEN 3.1* 2.2* 3.6*  PROCALCITON 0.12  --   --     ABG  Recent Labs Lab 11/03/16 1012 11/03/16 1300 11/03/16 1450  PHART 7.19* 7.23* 7.31*  PCO2ART 64* 48 40  PO2ART 68* 43* 385*    Liver Enzymes  Recent Labs Lab 11/02/16 1444  AST 110*  ALT 61*  ALKPHOS 174*  BILITOT 4.0*  ALBUMIN 2.8*    Cardiac Enzymes  Recent Labs Lab 11/02/16 1810 11/02/16 2243 11/03/16 0336  TROPONINI 0.76* 0.91* 0.85*    Glucose  Recent Labs Lab 11/03/16 0809 11/03/16 1128 11/03/16 1616 11/03/16 1945 11/03/16 2348 11/04/16 0406  GLUCAP 152* 140* 144* 174* 193* 225*    Imaging Dg Chest Port 1 View  Result Date: 11/03/2016 CLINICAL DATA:  Intubation. EXAM: PORTABLE CHEST 1 VIEW COMPARISON:  01/03/ 2018. FINDINGS: Interim placement endotracheal tube, its tip is 2.8 cm above the carina. Interim placement of left IJ line, its tip is projected superior  vena cava. NG tube in stable position. Cardiomegaly with pulmonary venous congestion. Mild interstitial prominence noted. Findings consistent with mild CHF. Low lung volumes. Small left pleural effusion. No pneumothorax. Pacing pads are noted. No acute bony abnormality . IMPRESSION: 1. Interim placement endotracheal tube and left IJ line. Tips are in good anatomic position. NG tube in stable position. 2. Cardiomegaly with pulmonary venous congestion, bilateral pulmonary interstitial prominence, small left pleural effusion. Findings suggest mild congestive heart failure . Low lung volumes. Electronically Signed   By: Marcello Moores  Register   On: 11/03/2016 15:27   Dg Chest Port 1 View  Result Date: 11/03/2016 CLINICAL DATA:  Acute respiratory failure. EXAM: PORTABLE CHEST 1 VIEW COMPARISON:  One-view chest x-ray 11/02/2016 FINDINGS: The heart is enlarged. The side port of an NG tube is in the fundus of the stomach. Slight increase in interstitial prominence suggests mild edema. Left basilar airspace disease is present. Atherosclerotic calcifications are present at the aortic arch. Degenerative changes are noted in the shoulders. IMPRESSION: 1. Cardiomegaly and increasing interstitial prominence suggesting edema and congestive heart failure. 2. Left lower lobe airspace disease likely reflects atelectasis. Infection is not excluded. 3. The side port of the NG tube is in the fundus of the stomach. Electronically Signed   By: San Morelle M.D.   On: 11/03/2016 12:39   Dg Abd Portable 1v  Result Date: 11/03/2016 CLINICAL DATA:  NG tube placement. EXAM: PORTABLE ABDOMEN - 1 VIEW COMPARISON:  One-view chest x-ray 11/02/2016. CT of the abdomen and pelvis 11/02/2016. FINDINGS: The side port of the NG tube is in the fundus of the stomach. There is mild gaseous distention of small bowel without obstruction. Minimal bibasilar airspace disease is stable. IMPRESSION: 1. The side port of the NG tube is in the fundus of the  stomach. 2. Mild gaseous distension small bowel without obstruction. Electronically Signed   By: San Morelle M.D.   On: 11/03/2016 12:38   STUDIES:  CT Abd Pelvis 01/2>>Severely nodular, shrunken liver compatible with cirrhosis. Large dilated recannulized umbilical vein compatible with portal venous hypertension. No acute findings in the abdomen or pelvis. Coronary artery disease, aortic atherosclerosis. CT Head 01/2>>Mild periventricular small vessel disease. No intracranial mass, hemorrhage, or extra-axial fluid collection. No acute infarct evident. There is a mild degree of empty sella. There are foci of arterial vascular calcification. There is mild paranasal sinus disease.  CULTURES: Blood x1 bottle 01/2>>staphylococcus species  Blood x1 anaerobic bottle only 01/2>>gram positive cocci  Blood x1 01/2>>negative  Urine  01/2>>  ANTIBIOTICS: Zosyn 01/2>> Vancomycin 01/2>>x1 dose   SIGNIFICANT EVENTS: 01/2-Pt admitted to [facility] ICU due to hepatic encephalopathy and septic shock secondary to pneumonia  01/3-PCCM consulted for further management  LINES/TUBES: PIV's x2 01/2>>  ASSESSMENT / PLAN:  PULMONARY A: Acute hypercapnic hypoxic respiratory failure secondary to pneumonia  P:   Continuous Bipap for now wean as tolerated Prn bronchodilators Routine CXR Routine ABG Continue Zosyn  CARDIOVASCULAR A:  Elevated troponin's secondary to demand ischemia vs. NSTEMI Hypotension secondary to septic shock Hx: HTN P:  Maintain map >65 Aggressive fluid resuscitation if map remains <65 will start levophed gtt Trend troponin's Cardiology consulted appreciate input Continuous telemetry monitoring  Hold outpatient lasix, amlodipine, quinapril, sotalol, and spironolactone Continue outpatient aspirin and plavix  Prn metoprolol for heart rate greater than 120 F/u ECHO Levo gtt    RENAL A:   Acute renal failure likely secondary to hypotension Lactic acidosis  P:   Trend  BMP's  Replace electrolytes as indicated  Monitor uop Lactic acid-3.6   GASTROINTESTINAL A:   Hyperammonemia  Hx: Hepatitis C and Cirrhosis of liver without ascites  P:   Keep NPO for now NG tube to administer medications Continue lactulose   HEMATOLOGIC A:   Anemia of chronic disease P:  Trend CBC's Heparin gtt for VTE prophylaxis  Monitor for s/sx of bleeding Transfuse for hgb <7  INFECTIOUS A:   Leukocytosis  Pneumonia  P:   Trend WBC's and monitor fever curve Continue abx as listed above Follow cultures   ENDOCRINE A:   Diabetes Mellitus P:   CBG's q4 hrs SSI  NEUROLOGIC A:   Hepatic encephalopathy secondary to Cirrhosis and Hepatocellular Carcinoma P:   Avoid sedating medications Continue lactulose WUA trials Lights on during the day    Bincy Varughese,AG-ACNP Pulmonary & Critical Care   Pt seen and examined with NP, agree assessment and plan, cirrhosis with hepatic encephalopathy with sepsis, continues measures including lactulose, abx, monitor kidney function.  Pt accepted for transfer at [facility], awaiting bed assignment.  Marda Stalker, M.D.  11/04/2016  [place].  I have personally obtained a history, examined the patient, evaluated laboratory and imaging results, formulated the assessment and plan and placed orders. The Patient requires high complexity decision making for assessment and support, frequent evaluation and titration of therapies, application of advanced monitoring technologies and extensive interpretation of multiple databases. The patient has critical illness that could lead imminently to failure of 1 or more organ systems and requires the highest level of physician preparedness to intervene.  Critical Care Time devoted to patient care services described in this note is 45 minutes and is exclusive of time spent in procedures.

## 2016-11-05 LAB — BLOOD GAS, ARTERIAL
ACID-BASE DEFICIT: 7.3 mmol/L — AB (ref 0.0–2.0)
Bicarbonate: 20.1 mmol/L (ref 20.0–28.0)
Delivery systems: POSITIVE
FIO2: 0.4
O2 SAT: 68.1 %
Patient temperature: 37
pCO2 arterial: 48 mmHg (ref 32.0–48.0)
pH, Arterial: 7.23 — ABNORMAL LOW (ref 7.350–7.450)
pO2, Arterial: 43 mmHg — ABNORMAL LOW (ref 83.0–108.0)

## 2016-11-05 LAB — CULTURE, BLOOD (ROUTINE X 2)

## 2016-11-06 LAB — CULTURE, BLOOD (ROUTINE X 2)

## 2016-12-02 DEATH — deceased

## 2017-02-14 IMAGING — CT CT HEAD W/O CM
3 series · 15 of 46 positions shown, 18 images · non-contrast
Comparison: August 11, 2015

CLINICAL DATA: Altered mental status.  Fever.

EXAM:
CT HEAD WITHOUT CONTRAST
TECHNIQUE: Contiguous axial images were obtained from the base of the skull
through the vertex without intravenous contrast.

[Series 2: head wo · axial · 0.42mm/px · z∈[+342,+462]mm · 9 of 29 slices shown, 12 images]
[im 3/29  brain]
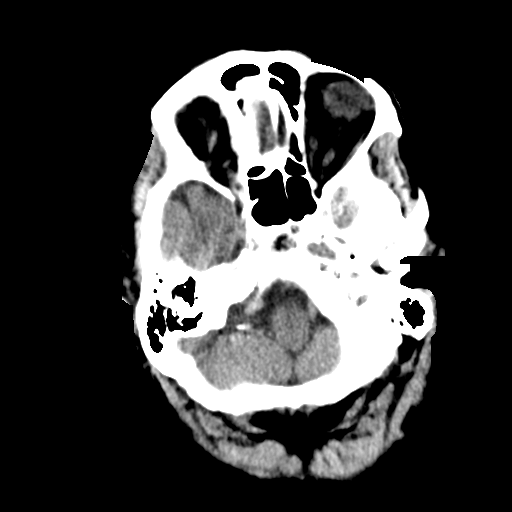
[im 3/29  bone]
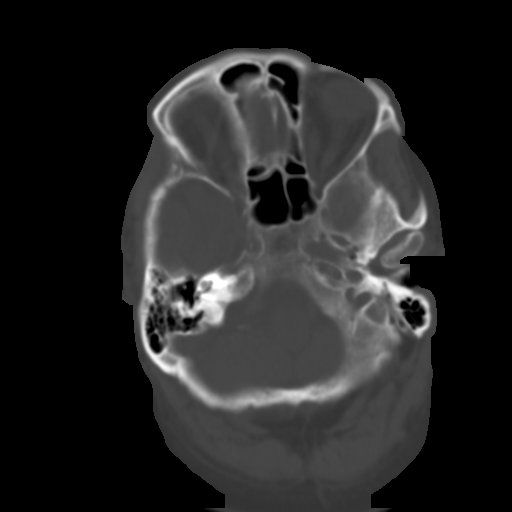
[im 6/29  brain]
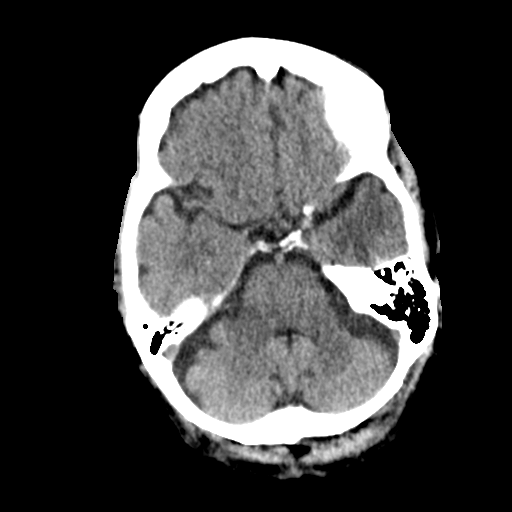
[im 9/29  brain]
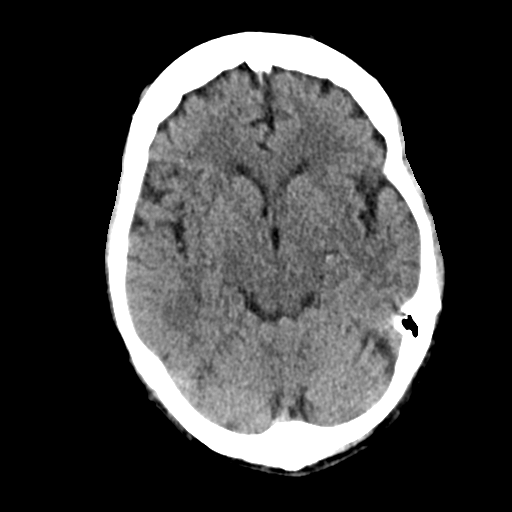
[im 12/29  brain]
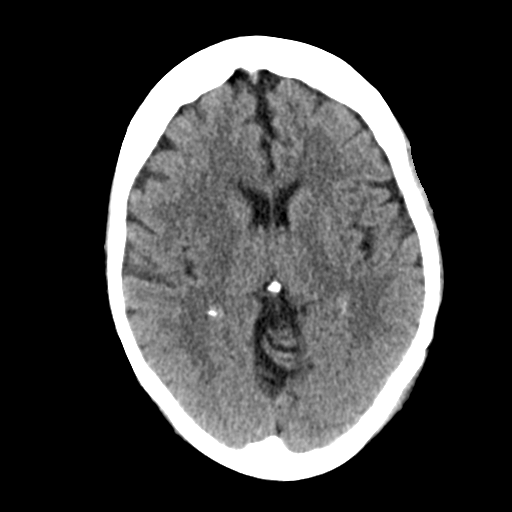
[im 15/29  brain]
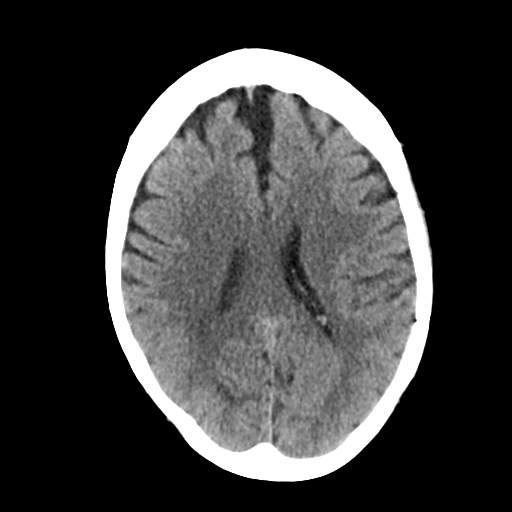
[im 15/29  bone]
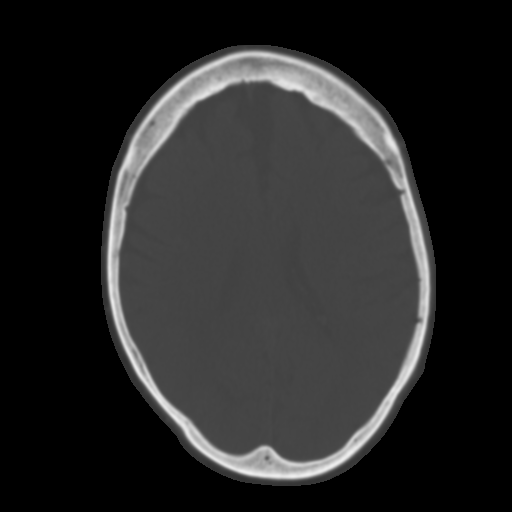
[im 18/29  brain]
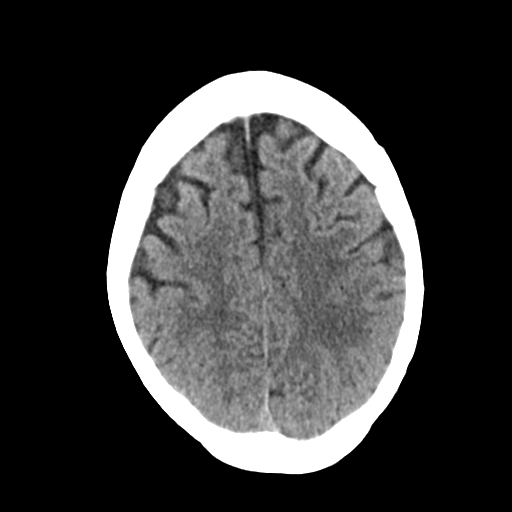
[im 21/29  brain]
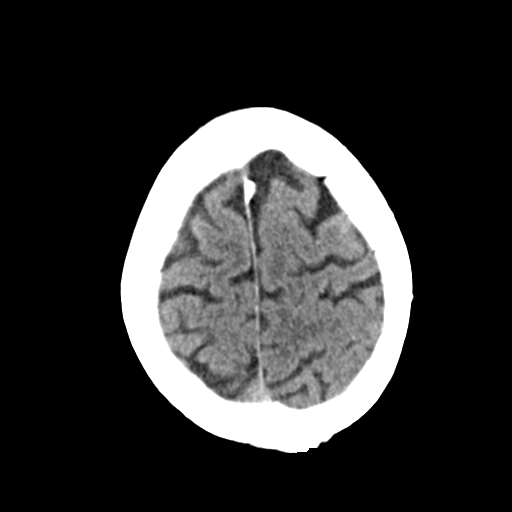
[im 24/29  brain]
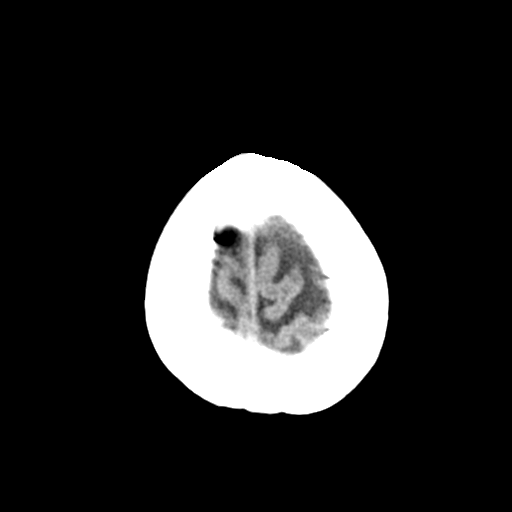
[im 27/29  brain]
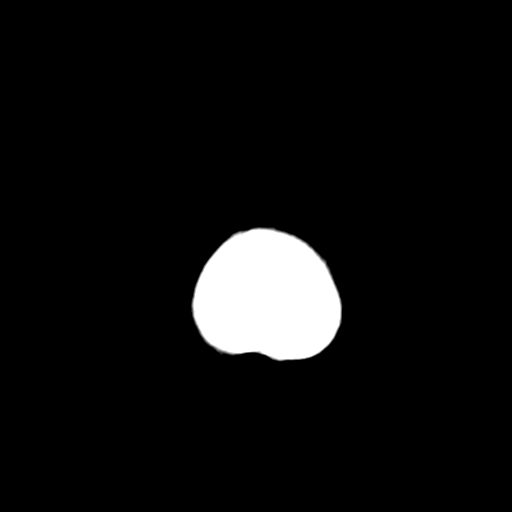
[im 27/29  bone]
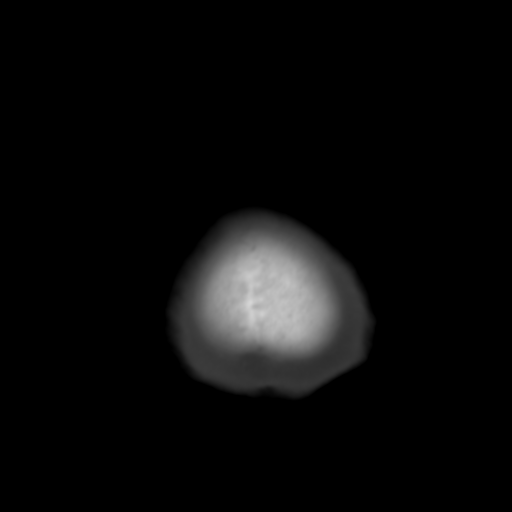

[Series 4: coronal soft tissue · coronal · 0.35mm/px · 3 of 68 slices shown]
[im 23/68  brain]
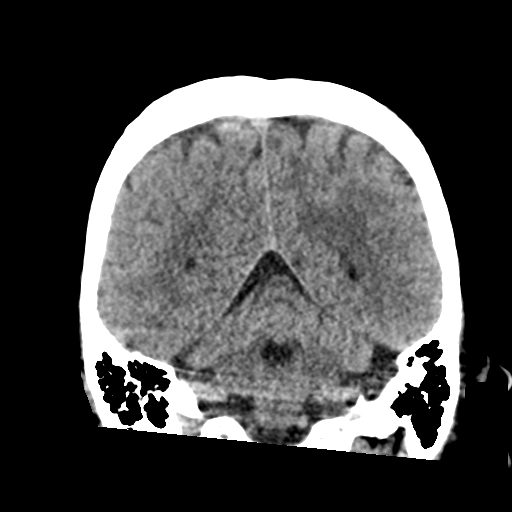
[im 30/68  brain]
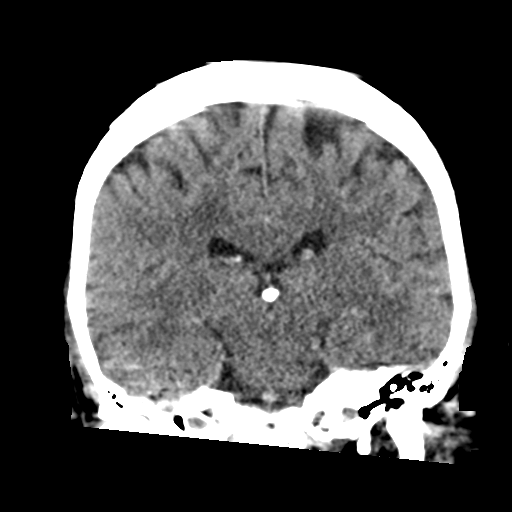
[im 38/68  brain]
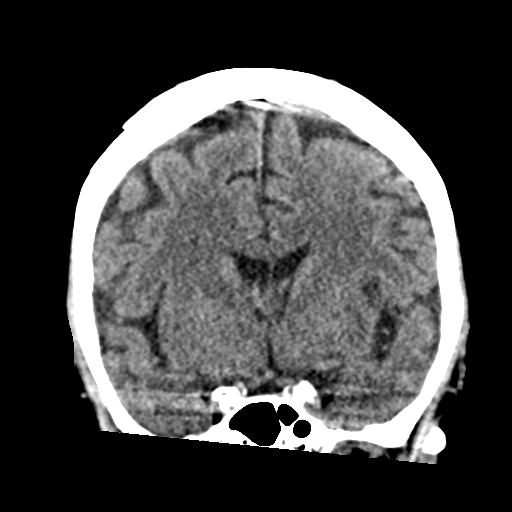

[Series 5: sagittal soft tissue · sagittal · 0.33mm/px · 3 of 54 slices shown]
[im 18/54  brain]
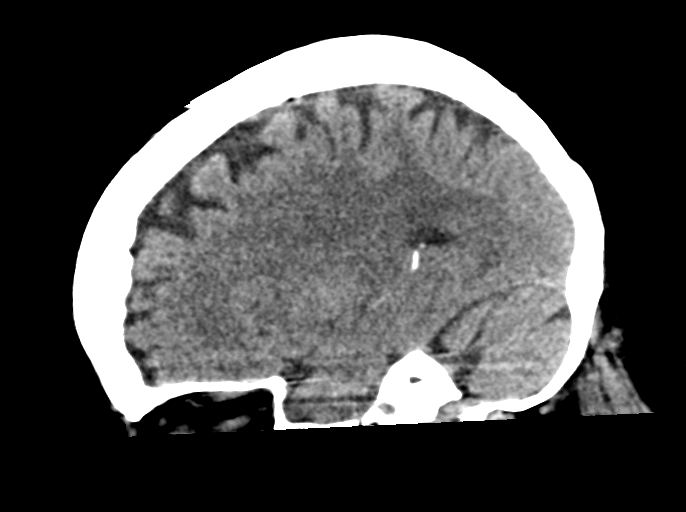
[im 27/54  brain]
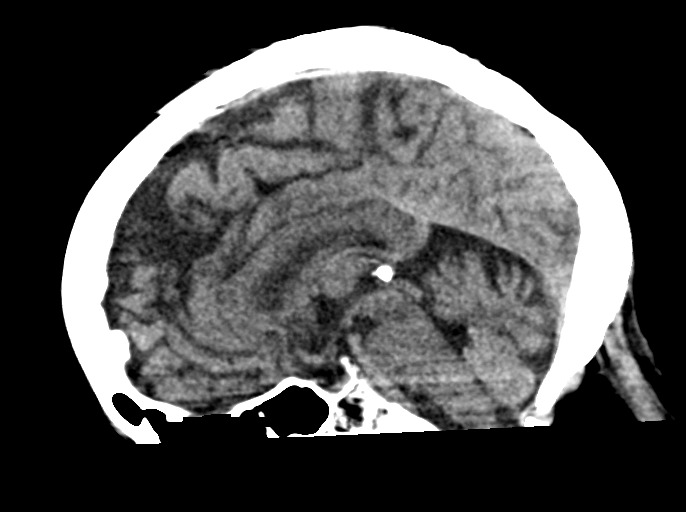
[im 36/54  brain]
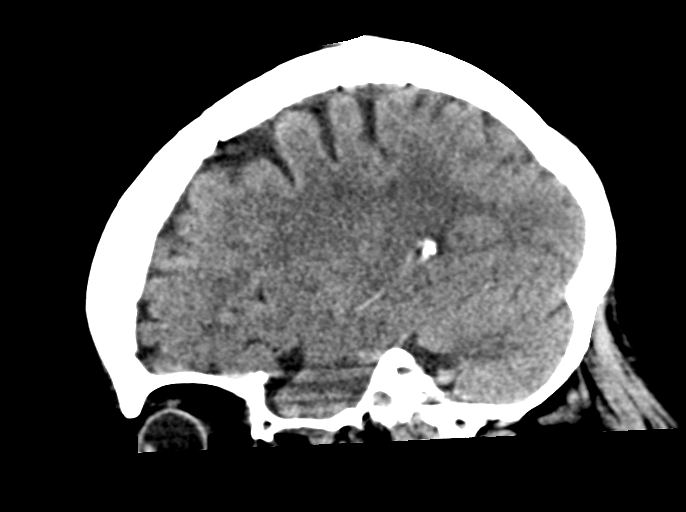

[15 of 46 positions shown; findings below may reference images not displayed]

FINDINGS: Brain: There is age related volume loss, stable. There is mild
invagination of CSF into the sella. There is no intracranial mass,
hemorrhage, extra-axial fluid collection, or midline shift. There is
slight small vessel disease in the centra semiovale bilaterally.
Gray-white compartments elsewhere appear normal. No acute infarct
evident.

Vascular: There is no hyperdense vessel. There is calcification in
the carotid siphon regions bilaterally. There is also calcification
in the distal vertebral artery on the left.

Skull: The bony calvarium appears intact.

Sinuses/Orbits: There is mucosal thickening in several ethmoid air
cells bilaterally. There is also mild mucosal thickening in the
right maxillary antrum. Other visualized paranasal sinuses are
clear. Visualized orbits appear symmetric bilaterally.

Other: Visualized mastoid air cells are clear.
IMPRESSION: Mild periventricular small vessel disease. No intracranial mass,
hemorrhage, or extra-axial fluid collection. No acute infarct
evident. There is a mild degree of empty sella.

There are foci of arterial vascular calcification. There is mild
paranasal sinus disease.

## 2017-02-15 IMAGING — DX DG ABD PORTABLE 1V
1 series · 1 of 1 positions shown · non-contrast
Comparison: One-view chest x-ray 11/02/2016. CT of the abdomen and
pelvis 11/02/2016.

CLINICAL DATA: NG tube placement.

EXAM:
PORTABLE ABDOMEN - 1 VIEW

[abdomen kub]
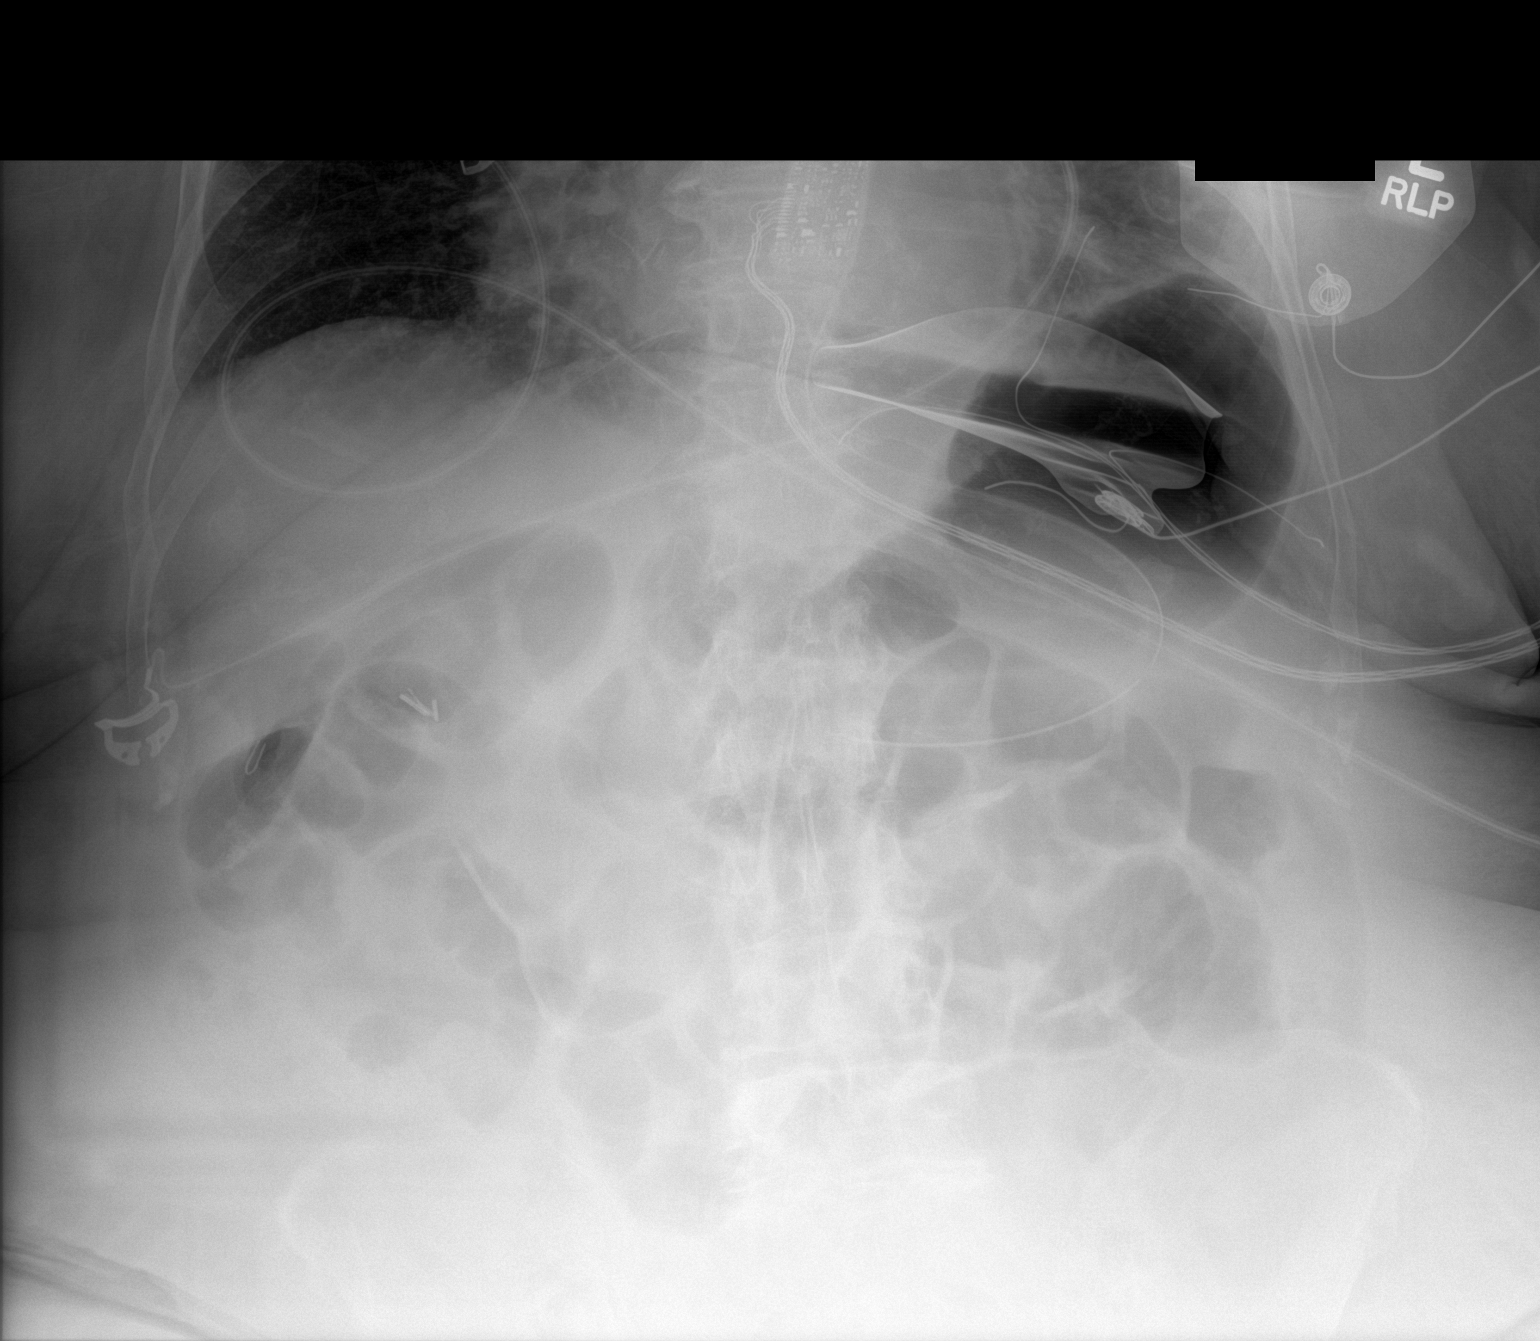

[1 of 1 positions shown; findings below may reference images not displayed]

FINDINGS: The side port of the NG tube is in the fundus of the stomach. There
is mild gaseous distention of small bowel without obstruction.
Minimal bibasilar airspace disease is stable.
IMPRESSION: 1. The side port of the NG tube is in the fundus of the stomach.
2. Mild gaseous distension small bowel without obstruction.

## 2017-02-16 IMAGING — DX DG CHEST 1V PORT
1 series · 1 of 1 positions shown · non-contrast
Comparison: 11/03/2016.

CLINICAL DATA: Respiratory failure.

EXAM:
PORTABLE CHEST 1 VIEW

[chest ap]
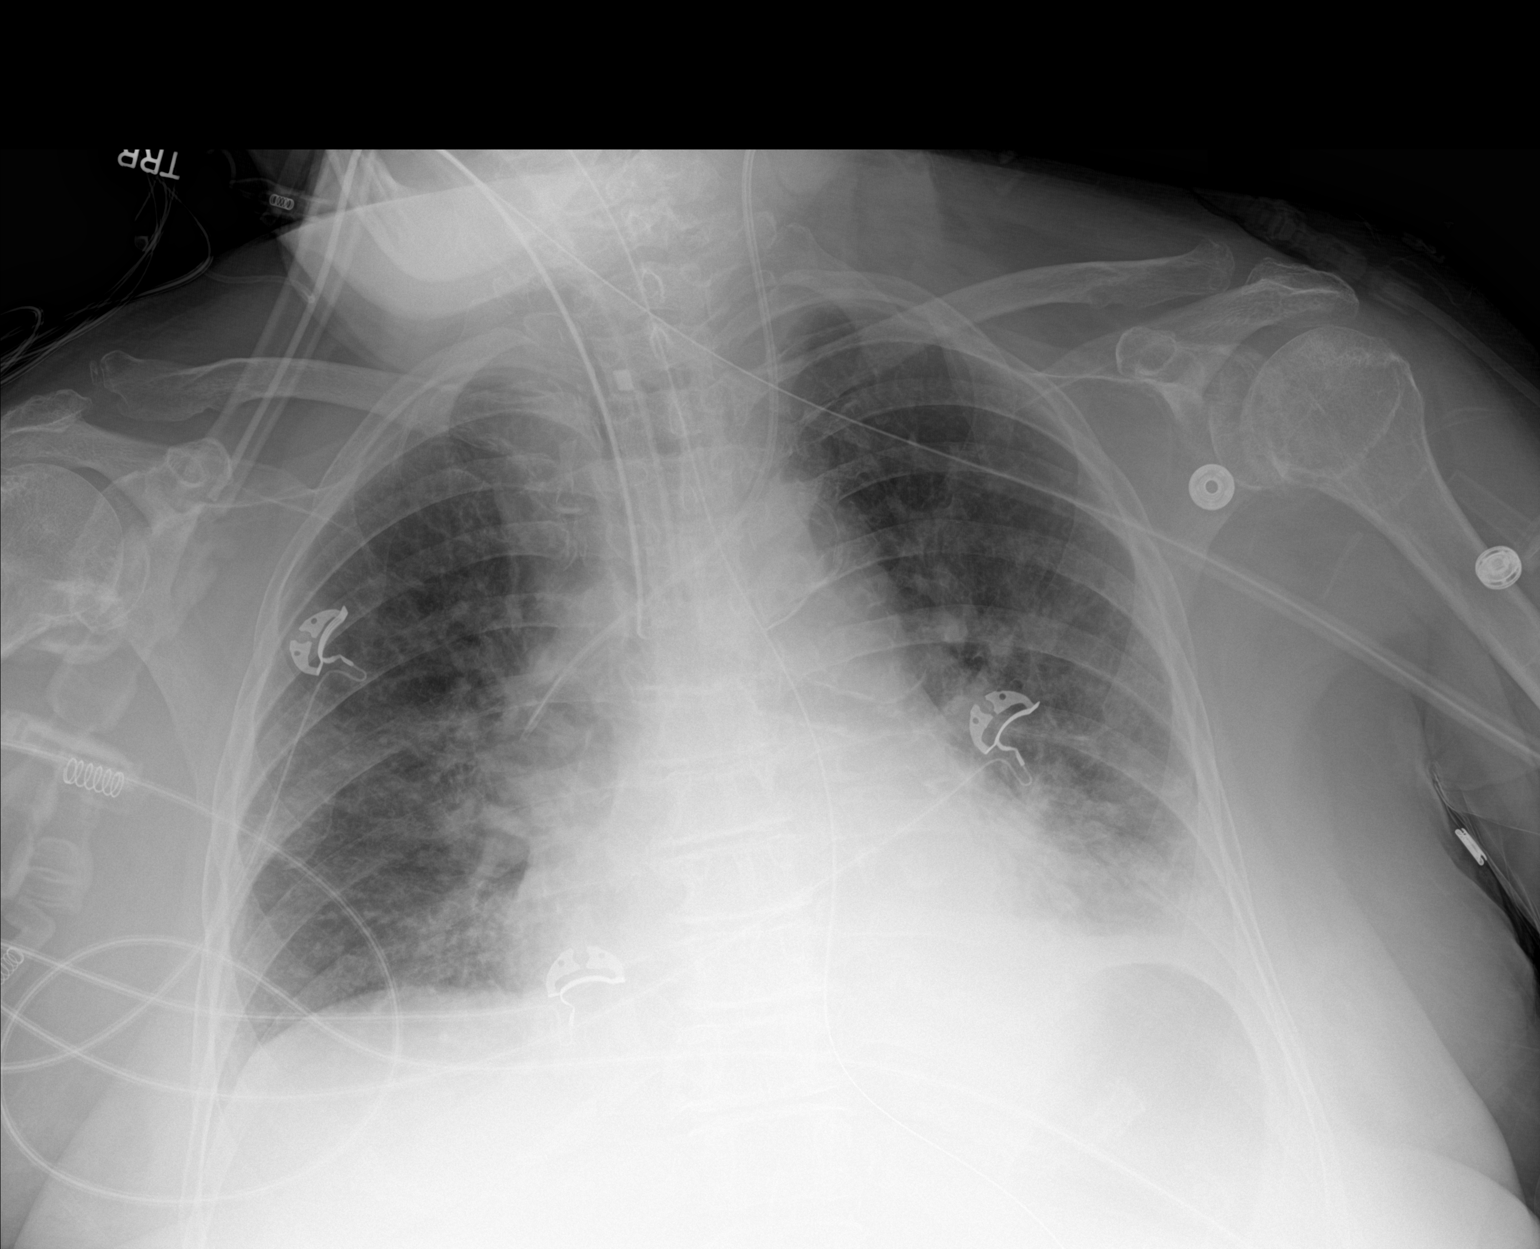

[1 of 1 positions shown; findings below may reference images not displayed]

FINDINGS: Endotracheal tube 1.2 cm above the carina. Withdrawal of
approximately 2 cm should be considered. Left IJ line and NG tube in
stable position. Persistent cardiomegaly with mild pulmonary
vascular prominence and interstitial prominence again noted.
Findings suggest mild CHF. Small left pleural effusion. Low lung
volumes again noted. No pneumothorax .
IMPRESSION: 1. Endotracheal tube 1.2 cm above the carina. Withdrawal
approximately 2 cm should be considered. Left IJ line NG tube in
stable position.

2. Persistent cardiomegaly with mild pulmonary vascular prominence
interstitial prominence. Small left pleural effusion . again noted.
Findings consistent with mild CHF. No significant change.

3. Persistent low lung volumes.

## 2017-05-26 ENCOUNTER — Ambulatory Visit: Payer: Medicare Other | Admitting: Obstetrics and Gynecology
